# Patient Record
Sex: Female | Born: 1970
Health system: Southern US, Community
[De-identification: ages and names within clinical notes are randomized; demographics above are authoritative.]

## PROBLEM LIST (undated history)

## (undated) DIAGNOSIS — T7840XA Allergy, unspecified, initial encounter: Secondary | ICD-10-CM

## (undated) DIAGNOSIS — E119 Type 2 diabetes mellitus without complications: Secondary | ICD-10-CM

## (undated) DIAGNOSIS — C801 Malignant (primary) neoplasm, unspecified: Secondary | ICD-10-CM

## (undated) DIAGNOSIS — N809 Endometriosis, unspecified: Secondary | ICD-10-CM

## (undated) DIAGNOSIS — Z8669 Personal history of other diseases of the nervous system and sense organs: Secondary | ICD-10-CM

## (undated) DIAGNOSIS — F329 Major depressive disorder, single episode, unspecified: Secondary | ICD-10-CM

## (undated) DIAGNOSIS — K219 Gastro-esophageal reflux disease without esophagitis: Secondary | ICD-10-CM

## (undated) DIAGNOSIS — I10 Essential (primary) hypertension: Secondary | ICD-10-CM

## (undated) DIAGNOSIS — B019 Varicella without complication: Secondary | ICD-10-CM

## (undated) DIAGNOSIS — U071 COVID-19: Secondary | ICD-10-CM

## (undated) DIAGNOSIS — E785 Hyperlipidemia, unspecified: Secondary | ICD-10-CM

## (undated) DIAGNOSIS — F32A Depression, unspecified: Secondary | ICD-10-CM

## (undated) HISTORY — PX: BREAST SURGERY: SHX581

## (undated) HISTORY — DX: Hyperlipidemia, unspecified: E78.5

## (undated) HISTORY — DX: Personal history of other diseases of the nervous system and sense organs: Z86.69

## (undated) HISTORY — PX: OTHER SURGICAL HISTORY: SHX169

## (undated) HISTORY — DX: Allergy, unspecified, initial encounter: T78.40XA

## (undated) HISTORY — PX: CHOLECYSTECTOMY: SHX55

## (undated) HISTORY — PX: LEEP: SHX91

## (undated) HISTORY — PX: MOHS SURGERY: SUR867

## (undated) HISTORY — DX: Major depressive disorder, single episode, unspecified: F32.9

## (undated) HISTORY — DX: Endometriosis, unspecified: N80.9

## (undated) HISTORY — DX: COVID-19: U07.1

## (undated) HISTORY — DX: Varicella without complication: B01.9

## (undated) HISTORY — PX: ABDOMINAL HYSTERECTOMY: SHX81

## (undated) HISTORY — DX: Depression, unspecified: F32.A

## (undated) HISTORY — DX: Essential (primary) hypertension: I10

## (undated) HISTORY — DX: Malignant (primary) neoplasm, unspecified: C80.1

## (undated) HISTORY — DX: Gastro-esophageal reflux disease without esophagitis: K21.9

## (undated) HISTORY — DX: Type 2 diabetes mellitus without complications: E11.9

---

## 2005-04-14 ENCOUNTER — Emergency Department: Payer: Self-pay | Admitting: General Practice

## 2005-09-19 ENCOUNTER — Ambulatory Visit: Payer: Self-pay

## 2005-11-29 ENCOUNTER — Inpatient Hospital Stay: Payer: Self-pay

## 2006-11-12 ENCOUNTER — Ambulatory Visit: Payer: Self-pay | Admitting: Obstetrics & Gynecology

## 2006-12-03 ENCOUNTER — Ambulatory Visit: Payer: Self-pay | Admitting: Obstetrics & Gynecology

## 2006-12-31 ENCOUNTER — Ambulatory Visit: Payer: Self-pay | Admitting: Obstetrics & Gynecology

## 2006-12-31 ENCOUNTER — Observation Stay: Payer: Self-pay

## 2007-01-03 ENCOUNTER — Observation Stay: Payer: Self-pay

## 2007-01-24 ENCOUNTER — Other Ambulatory Visit: Payer: Self-pay

## 2007-01-24 ENCOUNTER — Inpatient Hospital Stay: Payer: Self-pay | Admitting: Unknown Physician Specialty

## 2007-01-28 ENCOUNTER — Ambulatory Visit: Payer: Self-pay | Admitting: Internal Medicine

## 2008-01-30 DIAGNOSIS — D239 Other benign neoplasm of skin, unspecified: Secondary | ICD-10-CM

## 2008-01-30 HISTORY — DX: Other benign neoplasm of skin, unspecified: D23.9

## 2008-12-16 ENCOUNTER — Ambulatory Visit: Payer: Self-pay | Admitting: Otolaryngology

## 2009-03-09 ENCOUNTER — Ambulatory Visit: Payer: Self-pay | Admitting: Unknown Physician Specialty

## 2009-09-28 ENCOUNTER — Ambulatory Visit: Payer: Self-pay | Admitting: Unknown Physician Specialty

## 2009-10-04 ENCOUNTER — Inpatient Hospital Stay: Payer: Self-pay | Admitting: Unknown Physician Specialty

## 2009-10-27 ENCOUNTER — Ambulatory Visit: Payer: Self-pay | Admitting: Unknown Physician Specialty

## 2010-03-13 ENCOUNTER — Ambulatory Visit: Payer: Self-pay | Admitting: Unknown Physician Specialty

## 2010-03-21 ENCOUNTER — Ambulatory Visit: Payer: Self-pay | Admitting: Unknown Physician Specialty

## 2010-10-22 ENCOUNTER — Inpatient Hospital Stay: Payer: Self-pay | Admitting: Internal Medicine

## 2011-03-27 ENCOUNTER — Ambulatory Visit: Payer: Self-pay | Admitting: Family Medicine

## 2011-05-10 IMAGING — CR DG CHEST 2V
1 series · 2 of 2 positions shown · non-contrast
Comparison: none

REASON FOR EXAM: chest pain
COMMENTS:

PROCEDURE:     DXR - DXR CHEST PA (OR AP) AND LATERAL  - October 22, 2010 [DATE]
RESULT:     The lungs are clear. The cardiac silhouette and visualized bony
skeleton are unremarkable.

[Series 1: view not recorded · 0.17mm/px · 2 of 2 slices shown]
[im 1/2]
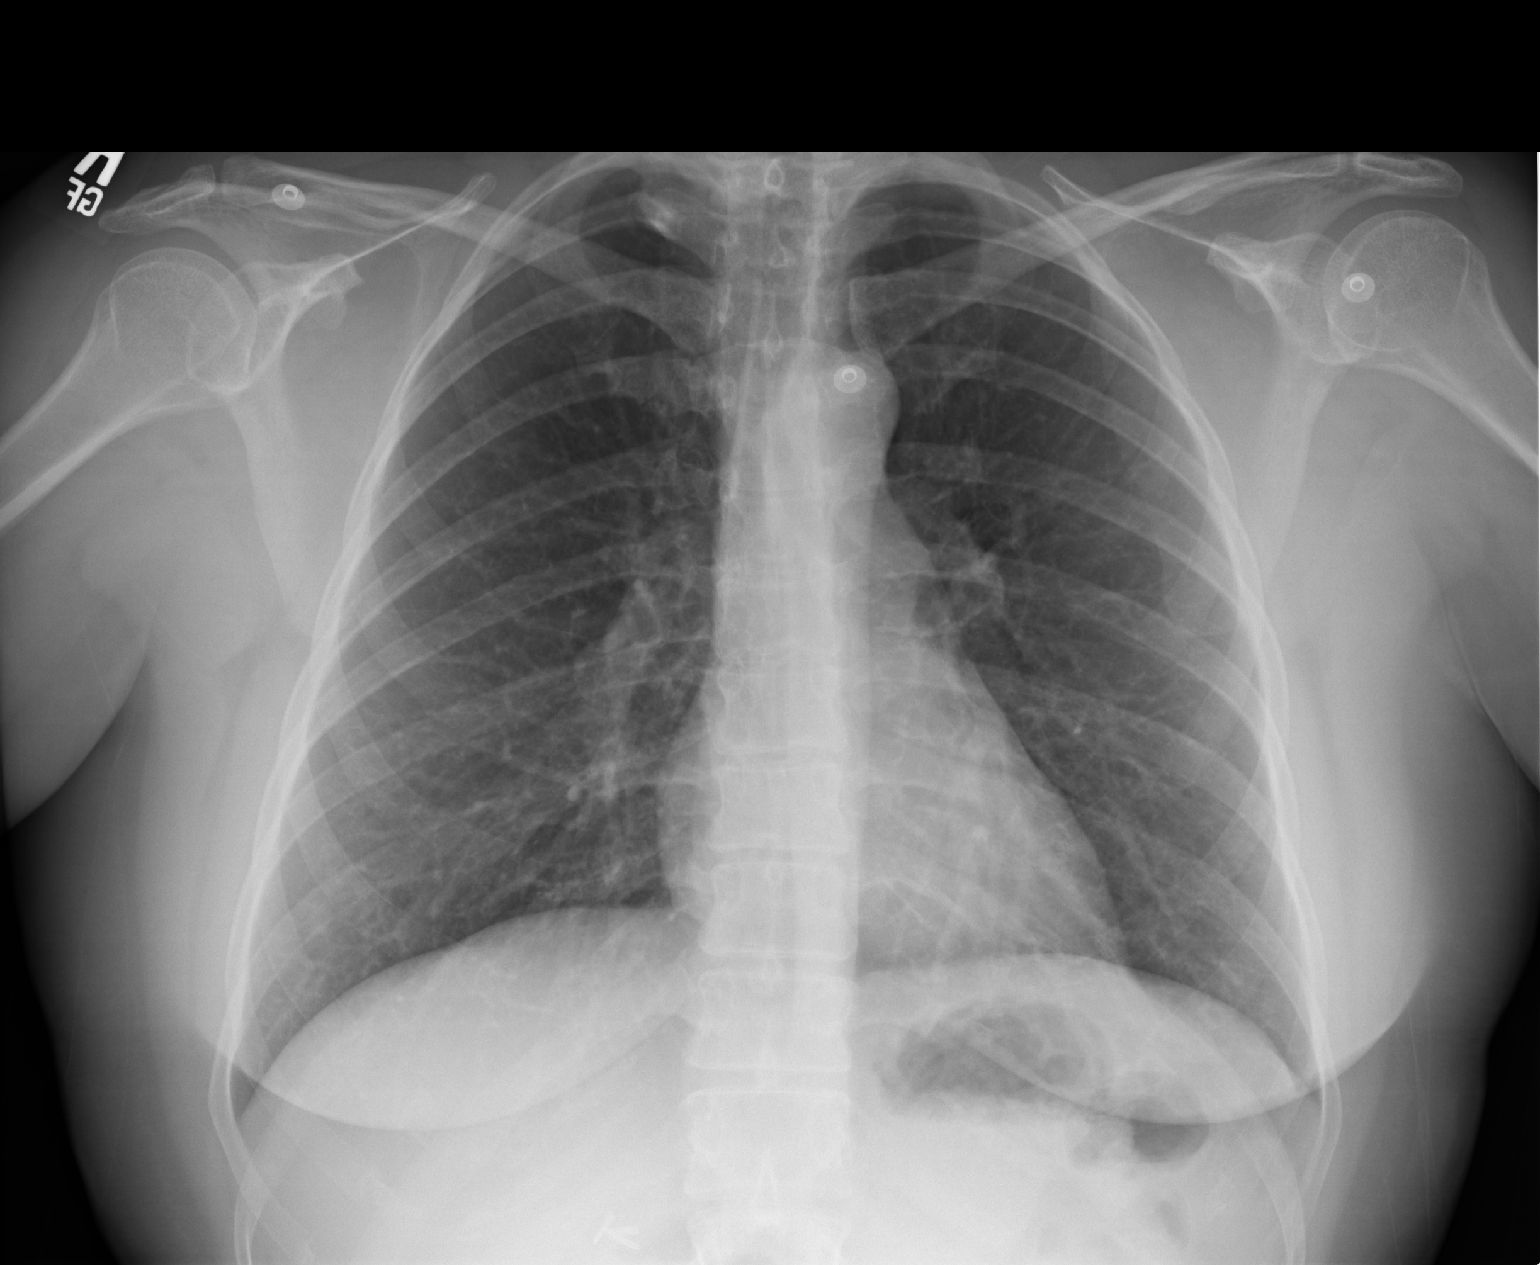
[im 2/2]
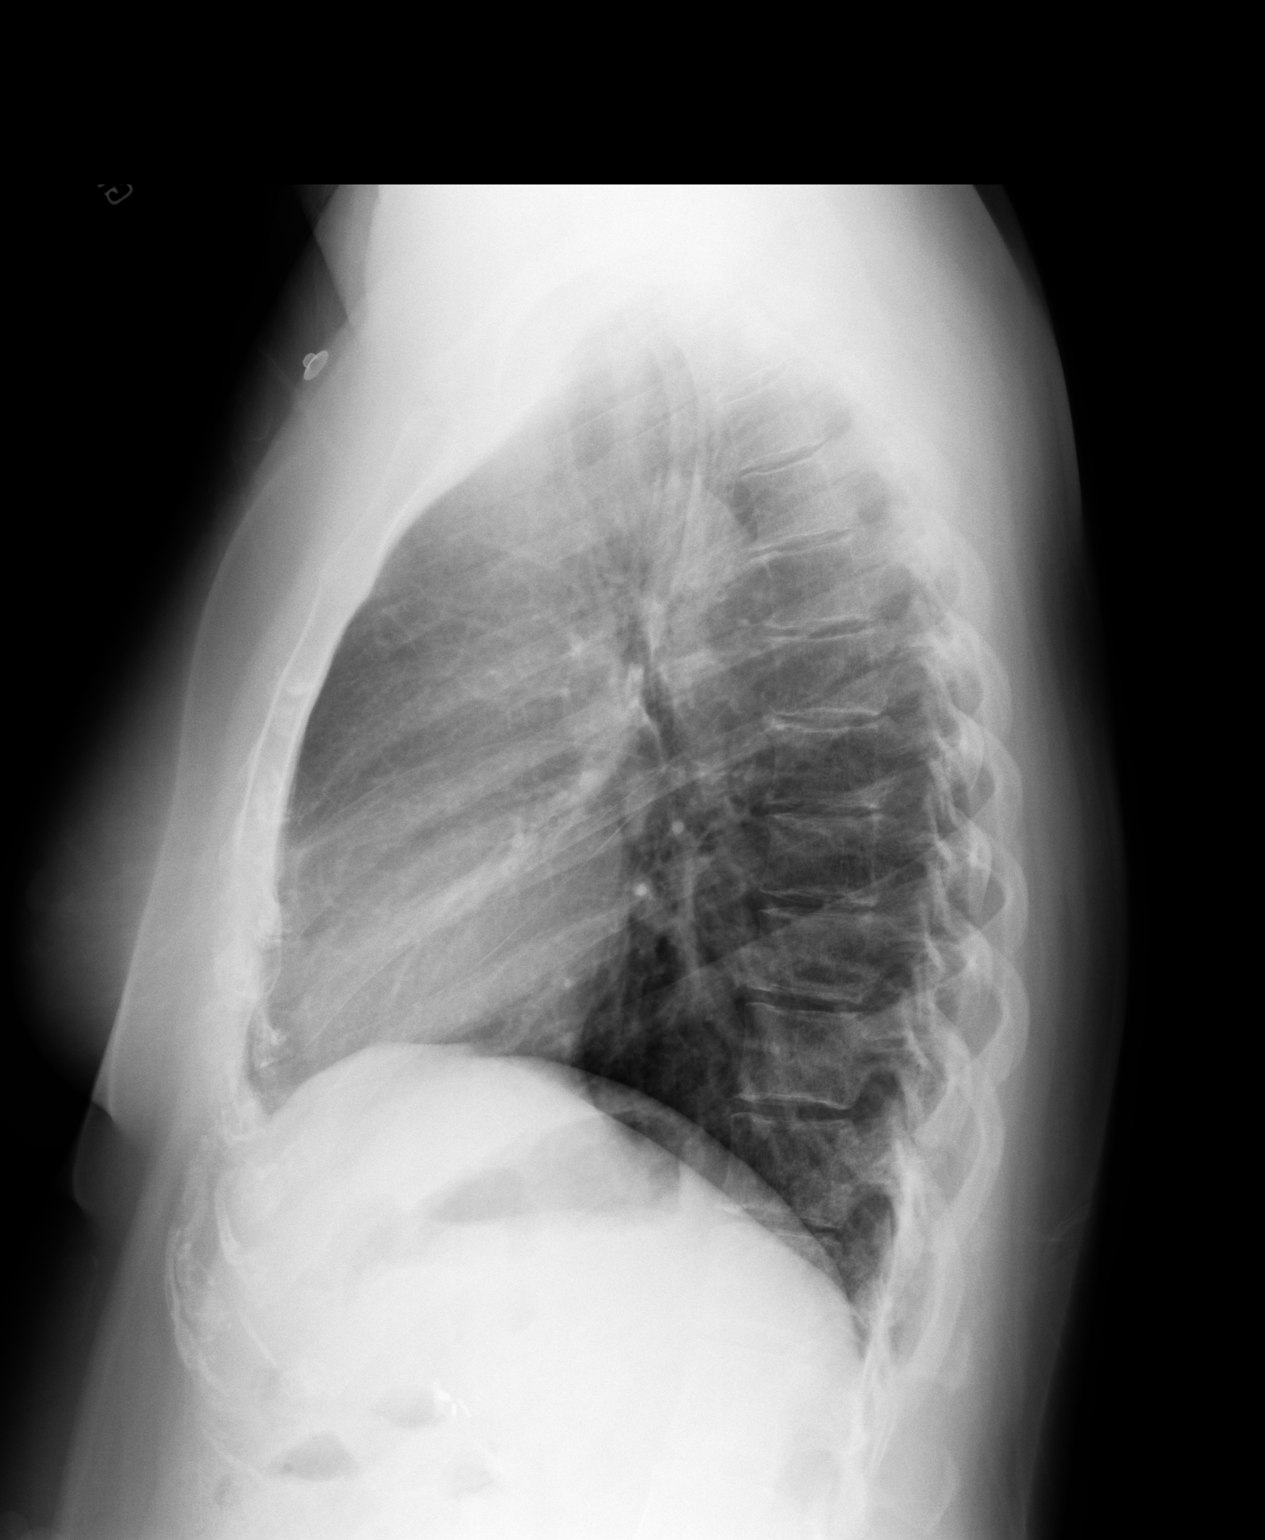

[2 of 2 positions shown; findings below may reference images not displayed]

IMPRESSION: 1. Chest radiograph without evidence of acute cardiopulmonary disease.

## 2012-04-10 ENCOUNTER — Ambulatory Visit: Payer: Self-pay | Admitting: Family Medicine

## 2012-06-12 ENCOUNTER — Ambulatory Visit: Payer: Self-pay | Admitting: Internal Medicine

## 2013-04-15 ENCOUNTER — Ambulatory Visit: Payer: Self-pay | Admitting: Family Medicine

## 2014-04-15 ENCOUNTER — Ambulatory Visit: Payer: Self-pay | Admitting: Family Medicine

## 2015-04-19 ENCOUNTER — Ambulatory Visit: Admit: 2015-04-19 | Disposition: A | Discharge status: Home / Self Care | Payer: Self-pay | Admitting: Family Medicine

## 2015-04-19 ENCOUNTER — Encounter: Payer: Self-pay | Admitting: Family Medicine

## 2015-05-09 LAB — HM PAP SMEAR: HM Pap smear: NEGATIVE

## 2017-05-02 DIAGNOSIS — Z1231 Encounter for screening mammogram for malignant neoplasm of breast: Secondary | ICD-10-CM | POA: Diagnosis not present

## 2017-05-29 DIAGNOSIS — I10 Essential (primary) hypertension: Secondary | ICD-10-CM | POA: Diagnosis not present

## 2017-05-29 DIAGNOSIS — K219 Gastro-esophageal reflux disease without esophagitis: Secondary | ICD-10-CM | POA: Diagnosis not present

## 2017-05-29 DIAGNOSIS — E1165 Type 2 diabetes mellitus with hyperglycemia: Secondary | ICD-10-CM | POA: Diagnosis not present

## 2017-05-29 DIAGNOSIS — E7800 Pure hypercholesterolemia, unspecified: Secondary | ICD-10-CM | POA: Diagnosis not present

## 2017-06-12 DIAGNOSIS — L700 Acne vulgaris: Secondary | ICD-10-CM | POA: Diagnosis not present

## 2017-06-12 DIAGNOSIS — L718 Other rosacea: Secondary | ICD-10-CM | POA: Diagnosis not present

## 2017-06-12 DIAGNOSIS — D485 Neoplasm of uncertain behavior of skin: Secondary | ICD-10-CM | POA: Diagnosis not present

## 2017-06-12 DIAGNOSIS — Z85828 Personal history of other malignant neoplasm of skin: Secondary | ICD-10-CM | POA: Diagnosis not present

## 2017-06-12 DIAGNOSIS — C44519 Basal cell carcinoma of skin of other part of trunk: Secondary | ICD-10-CM | POA: Diagnosis not present

## 2017-06-12 DIAGNOSIS — Z1283 Encounter for screening for malignant neoplasm of skin: Secondary | ICD-10-CM | POA: Diagnosis not present

## 2017-06-12 DIAGNOSIS — C4491 Basal cell carcinoma of skin, unspecified: Secondary | ICD-10-CM

## 2017-06-12 HISTORY — DX: Basal cell carcinoma of skin, unspecified: C44.91

## 2017-08-12 DIAGNOSIS — C44519 Basal cell carcinoma of skin of other part of trunk: Secondary | ICD-10-CM | POA: Diagnosis not present

## 2017-09-04 DIAGNOSIS — E1165 Type 2 diabetes mellitus with hyperglycemia: Secondary | ICD-10-CM | POA: Diagnosis not present

## 2017-09-04 DIAGNOSIS — E7800 Pure hypercholesterolemia, unspecified: Secondary | ICD-10-CM | POA: Diagnosis not present

## 2017-09-04 DIAGNOSIS — K219 Gastro-esophageal reflux disease without esophagitis: Secondary | ICD-10-CM | POA: Diagnosis not present

## 2017-09-04 DIAGNOSIS — I10 Essential (primary) hypertension: Secondary | ICD-10-CM | POA: Diagnosis not present

## 2017-12-28 DIAGNOSIS — S99921A Unspecified injury of right foot, initial encounter: Secondary | ICD-10-CM | POA: Diagnosis not present

## 2017-12-28 DIAGNOSIS — S99911A Unspecified injury of right ankle, initial encounter: Secondary | ICD-10-CM | POA: Diagnosis not present

## 2017-12-28 DIAGNOSIS — W108XXA Fall (on) (from) other stairs and steps, initial encounter: Secondary | ICD-10-CM | POA: Diagnosis not present

## 2018-01-30 DIAGNOSIS — E7800 Pure hypercholesterolemia, unspecified: Secondary | ICD-10-CM | POA: Diagnosis not present

## 2018-01-30 DIAGNOSIS — E119 Type 2 diabetes mellitus without complications: Secondary | ICD-10-CM | POA: Diagnosis not present

## 2018-01-30 DIAGNOSIS — I10 Essential (primary) hypertension: Secondary | ICD-10-CM | POA: Diagnosis not present

## 2018-01-30 DIAGNOSIS — K219 Gastro-esophageal reflux disease without esophagitis: Secondary | ICD-10-CM | POA: Diagnosis not present

## 2018-05-06 DIAGNOSIS — Z1231 Encounter for screening mammogram for malignant neoplasm of breast: Secondary | ICD-10-CM | POA: Diagnosis not present

## 2018-05-20 DIAGNOSIS — R928 Other abnormal and inconclusive findings on diagnostic imaging of breast: Secondary | ICD-10-CM | POA: Diagnosis not present

## 2018-06-02 DIAGNOSIS — R229 Localized swelling, mass and lump, unspecified: Secondary | ICD-10-CM | POA: Diagnosis not present

## 2018-06-02 DIAGNOSIS — D0511 Intraductal carcinoma in situ of right breast: Secondary | ICD-10-CM | POA: Diagnosis not present

## 2018-06-02 DIAGNOSIS — R920 Mammographic microcalcification found on diagnostic imaging of breast: Secondary | ICD-10-CM | POA: Diagnosis not present

## 2018-06-13 DIAGNOSIS — D0591 Unspecified type of carcinoma in situ of right breast: Secondary | ICD-10-CM | POA: Diagnosis not present

## 2018-06-13 DIAGNOSIS — Z803 Family history of malignant neoplasm of breast: Secondary | ICD-10-CM | POA: Diagnosis not present

## 2018-06-13 DIAGNOSIS — C50919 Malignant neoplasm of unspecified site of unspecified female breast: Secondary | ICD-10-CM | POA: Diagnosis not present

## 2018-06-25 DIAGNOSIS — Z853 Personal history of malignant neoplasm of breast: Secondary | ICD-10-CM | POA: Diagnosis not present

## 2018-07-16 DIAGNOSIS — Z87891 Personal history of nicotine dependence: Secondary | ICD-10-CM | POA: Diagnosis not present

## 2018-07-16 DIAGNOSIS — D0581 Other specified type of carcinoma in situ of right breast: Secondary | ICD-10-CM | POA: Diagnosis not present

## 2018-07-16 DIAGNOSIS — I10 Essential (primary) hypertension: Secondary | ICD-10-CM | POA: Diagnosis not present

## 2018-07-16 DIAGNOSIS — C50911 Malignant neoplasm of unspecified site of right female breast: Secondary | ICD-10-CM | POA: Diagnosis not present

## 2018-07-16 DIAGNOSIS — D0511 Intraductal carcinoma in situ of right breast: Secondary | ICD-10-CM | POA: Diagnosis not present

## 2018-07-16 DIAGNOSIS — E669 Obesity, unspecified: Secondary | ICD-10-CM | POA: Diagnosis not present

## 2018-07-16 DIAGNOSIS — D0591 Unspecified type of carcinoma in situ of right breast: Secondary | ICD-10-CM | POA: Diagnosis not present

## 2018-07-16 DIAGNOSIS — F419 Anxiety disorder, unspecified: Secondary | ICD-10-CM | POA: Diagnosis not present

## 2018-07-16 DIAGNOSIS — Z79899 Other long term (current) drug therapy: Secondary | ICD-10-CM | POA: Diagnosis not present

## 2018-07-16 DIAGNOSIS — N6012 Diffuse cystic mastopathy of left breast: Secondary | ICD-10-CM | POA: Diagnosis not present

## 2018-07-16 DIAGNOSIS — Z6835 Body mass index (BMI) 35.0-35.9, adult: Secondary | ICD-10-CM | POA: Diagnosis not present

## 2018-07-16 DIAGNOSIS — K219 Gastro-esophageal reflux disease without esophagitis: Secondary | ICD-10-CM | POA: Diagnosis not present

## 2018-07-16 DIAGNOSIS — D242 Benign neoplasm of left breast: Secondary | ICD-10-CM | POA: Diagnosis not present

## 2018-07-16 DIAGNOSIS — Z853 Personal history of malignant neoplasm of breast: Secondary | ICD-10-CM | POA: Diagnosis not present

## 2018-08-04 DIAGNOSIS — Z9882 Breast implant status: Secondary | ICD-10-CM | POA: Diagnosis not present

## 2018-08-04 DIAGNOSIS — D0591 Unspecified type of carcinoma in situ of right breast: Secondary | ICD-10-CM | POA: Diagnosis not present

## 2018-08-04 DIAGNOSIS — Z09 Encounter for follow-up examination after completed treatment for conditions other than malignant neoplasm: Secondary | ICD-10-CM | POA: Diagnosis not present

## 2018-08-04 DIAGNOSIS — I10 Essential (primary) hypertension: Secondary | ICD-10-CM | POA: Diagnosis not present

## 2018-08-04 DIAGNOSIS — Z86000 Personal history of in-situ neoplasm of breast: Secondary | ICD-10-CM | POA: Diagnosis not present

## 2018-08-04 DIAGNOSIS — Z87891 Personal history of nicotine dependence: Secondary | ICD-10-CM | POA: Diagnosis not present

## 2018-08-04 DIAGNOSIS — Z9013 Acquired absence of bilateral breasts and nipples: Secondary | ICD-10-CM | POA: Diagnosis not present

## 2018-08-11 ENCOUNTER — Encounter: Payer: Self-pay | Admitting: Certified Nurse Midwife

## 2018-08-11 DIAGNOSIS — D0511 Intraductal carcinoma in situ of right breast: Secondary | ICD-10-CM | POA: Insufficient documentation

## 2018-08-11 HISTORY — DX: Intraductal carcinoma in situ of right breast: D05.11

## 2018-09-12 DIAGNOSIS — E7800 Pure hypercholesterolemia, unspecified: Secondary | ICD-10-CM | POA: Diagnosis not present

## 2018-09-12 DIAGNOSIS — I10 Essential (primary) hypertension: Secondary | ICD-10-CM | POA: Diagnosis not present

## 2018-09-12 DIAGNOSIS — K219 Gastro-esophageal reflux disease without esophagitis: Secondary | ICD-10-CM | POA: Diagnosis not present

## 2018-09-12 DIAGNOSIS — E1165 Type 2 diabetes mellitus with hyperglycemia: Secondary | ICD-10-CM | POA: Diagnosis not present

## 2018-10-01 DIAGNOSIS — Z853 Personal history of malignant neoplasm of breast: Secondary | ICD-10-CM | POA: Diagnosis not present

## 2018-10-01 DIAGNOSIS — E669 Obesity, unspecified: Secondary | ICD-10-CM | POA: Diagnosis not present

## 2018-10-01 DIAGNOSIS — Z87891 Personal history of nicotine dependence: Secondary | ICD-10-CM | POA: Diagnosis not present

## 2018-10-01 DIAGNOSIS — K219 Gastro-esophageal reflux disease without esophagitis: Secondary | ICD-10-CM | POA: Diagnosis not present

## 2018-10-01 DIAGNOSIS — I10 Essential (primary) hypertension: Secondary | ICD-10-CM | POA: Diagnosis not present

## 2018-10-01 DIAGNOSIS — Z6834 Body mass index (BMI) 34.0-34.9, adult: Secondary | ICD-10-CM | POA: Diagnosis not present

## 2018-10-01 DIAGNOSIS — E119 Type 2 diabetes mellitus without complications: Secondary | ICD-10-CM | POA: Diagnosis not present

## 2018-10-01 DIAGNOSIS — Z421 Encounter for breast reconstruction following mastectomy: Secondary | ICD-10-CM | POA: Diagnosis not present

## 2018-10-09 DIAGNOSIS — C50911 Malignant neoplasm of unspecified site of right female breast: Secondary | ICD-10-CM | POA: Diagnosis not present

## 2018-10-28 DIAGNOSIS — C50911 Malignant neoplasm of unspecified site of right female breast: Secondary | ICD-10-CM | POA: Diagnosis not present

## 2018-11-14 DIAGNOSIS — Z853 Personal history of malignant neoplasm of breast: Secondary | ICD-10-CM | POA: Diagnosis not present

## 2018-11-14 DIAGNOSIS — Z833 Family history of diabetes mellitus: Secondary | ICD-10-CM | POA: Diagnosis not present

## 2018-11-14 DIAGNOSIS — N611 Abscess of the breast and nipple: Secondary | ICD-10-CM | POA: Diagnosis not present

## 2018-11-14 DIAGNOSIS — I10 Essential (primary) hypertension: Secondary | ICD-10-CM | POA: Diagnosis not present

## 2018-11-14 DIAGNOSIS — Z421 Encounter for breast reconstruction following mastectomy: Secondary | ICD-10-CM | POA: Diagnosis not present

## 2018-11-14 DIAGNOSIS — Z79899 Other long term (current) drug therapy: Secondary | ICD-10-CM | POA: Diagnosis not present

## 2018-11-14 DIAGNOSIS — L905 Scar conditions and fibrosis of skin: Secondary | ICD-10-CM | POA: Diagnosis not present

## 2018-11-14 DIAGNOSIS — Z808 Family history of malignant neoplasm of other organs or systems: Secondary | ICD-10-CM | POA: Diagnosis not present

## 2018-11-14 DIAGNOSIS — Z803 Family history of malignant neoplasm of breast: Secondary | ICD-10-CM | POA: Diagnosis not present

## 2018-11-14 DIAGNOSIS — Z9013 Acquired absence of bilateral breasts and nipples: Secondary | ICD-10-CM | POA: Diagnosis not present

## 2018-12-02 ENCOUNTER — Encounter: Payer: Self-pay | Admitting: Internal Medicine

## 2018-12-10 DIAGNOSIS — C50911 Malignant neoplasm of unspecified site of right female breast: Secondary | ICD-10-CM | POA: Diagnosis not present

## 2018-12-10 DIAGNOSIS — Z9012 Acquired absence of left breast and nipple: Secondary | ICD-10-CM | POA: Diagnosis not present

## 2018-12-15 DIAGNOSIS — I10 Essential (primary) hypertension: Secondary | ICD-10-CM | POA: Diagnosis not present

## 2018-12-15 DIAGNOSIS — E1165 Type 2 diabetes mellitus with hyperglycemia: Secondary | ICD-10-CM | POA: Diagnosis not present

## 2018-12-15 DIAGNOSIS — E7800 Pure hypercholesterolemia, unspecified: Secondary | ICD-10-CM | POA: Diagnosis not present

## 2018-12-15 DIAGNOSIS — K219 Gastro-esophageal reflux disease without esophagitis: Secondary | ICD-10-CM | POA: Diagnosis not present

## 2019-01-06 DIAGNOSIS — L578 Other skin changes due to chronic exposure to nonionizing radiation: Secondary | ICD-10-CM | POA: Diagnosis not present

## 2019-01-06 DIAGNOSIS — L570 Actinic keratosis: Secondary | ICD-10-CM | POA: Diagnosis not present

## 2019-01-06 DIAGNOSIS — D485 Neoplasm of uncertain behavior of skin: Secondary | ICD-10-CM | POA: Diagnosis not present

## 2019-01-06 DIAGNOSIS — Z1283 Encounter for screening for malignant neoplasm of skin: Secondary | ICD-10-CM | POA: Diagnosis not present

## 2019-01-12 DIAGNOSIS — L03211 Cellulitis of face: Secondary | ICD-10-CM | POA: Diagnosis not present

## 2019-01-12 DIAGNOSIS — B0052 Herpesviral keratitis: Secondary | ICD-10-CM | POA: Diagnosis not present

## 2019-01-12 DIAGNOSIS — B0089 Other herpesviral infection: Secondary | ICD-10-CM | POA: Diagnosis not present

## 2019-01-19 DIAGNOSIS — B0052 Herpesviral keratitis: Secondary | ICD-10-CM | POA: Diagnosis not present

## 2019-02-12 ENCOUNTER — Other Ambulatory Visit: Payer: Self-pay

## 2019-02-12 ENCOUNTER — Ambulatory Visit: Payer: BLUE CROSS/BLUE SHIELD | Admitting: Internal Medicine

## 2019-02-12 VITALS — BP 126/84 | HR 80 | Temp 98.2°F | Ht 62.0 in | Wt 185.8 lb

## 2019-02-12 DIAGNOSIS — Z853 Personal history of malignant neoplasm of breast: Secondary | ICD-10-CM | POA: Diagnosis not present

## 2019-02-12 DIAGNOSIS — I10 Essential (primary) hypertension: Secondary | ICD-10-CM

## 2019-02-12 DIAGNOSIS — E785 Hyperlipidemia, unspecified: Secondary | ICD-10-CM

## 2019-02-12 DIAGNOSIS — E559 Vitamin D deficiency, unspecified: Secondary | ICD-10-CM

## 2019-02-12 DIAGNOSIS — F32A Depression, unspecified: Secondary | ICD-10-CM

## 2019-02-12 DIAGNOSIS — E119 Type 2 diabetes mellitus without complications: Secondary | ICD-10-CM | POA: Diagnosis not present

## 2019-02-12 DIAGNOSIS — F329 Major depressive disorder, single episode, unspecified: Secondary | ICD-10-CM

## 2019-02-12 NOTE — Progress Notes (Signed)
Pre visit review using our clinic review tool, if applicable. No additional management support is needed unless otherwise documented below in the visit note. 

## 2019-02-12 NOTE — Patient Instructions (Signed)
Please get me a copy of your labs from 11/2018   Diabetes Mellitus and Nutrition, Adult When you have diabetes (diabetes mellitus), it is very important to have healthy eating habits because your blood sugar (glucose) levels are greatly affected by what you eat and drink. Eating healthy foods in the appropriate amounts, at about the same times every day, can help you:  Control your blood glucose.  Lower your risk of heart disease.  Improve your blood pressure.  Reach or maintain a healthy weight. Every person with diabetes is different, and each person has different needs for a meal plan. Your health care provider may recommend that you work with a diet and nutrition specialist (dietitian) to make a meal plan that is best for you. Your meal plan may vary depending on factors such as:  The calories you need.  The medicines you take.  Your weight.  Your blood glucose, blood pressure, and cholesterol levels.  Your activity level.  Other health conditions you have, such as heart or kidney disease. How do carbohydrates affect me? Carbohydrates, also called carbs, affect your blood glucose level more than any other type of food. Eating carbs naturally raises the amount of glucose in your blood. Carb counting is a method for keeping track of how many carbs you eat. Counting carbs is important to keep your blood glucose at a healthy level, especially if you use insulin or take certain oral diabetes medicines. It is important to know how many carbs you can safely have in each meal. This is different for every person. Your dietitian can help you calculate how many carbs you should have at each meal and for each snack. Foods that contain carbs include:  Bread, cereal, rice, pasta, and crackers.  Potatoes and corn.  Peas, beans, and lentils.  Milk and yogurt.  Fruit and juice.  Desserts, such as cakes, cookies, ice cream, and candy. How does alcohol affect me? Alcohol can cause a sudden  decrease in blood glucose (hypoglycemia), especially if you use insulin or take certain oral diabetes medicines. Hypoglycemia can be a life-threatening condition. Symptoms of hypoglycemia (sleepiness, dizziness, and confusion) are similar to symptoms of having too much alcohol. If your health care provider says that alcohol is safe for you, follow these guidelines:  Limit alcohol intake to no more than 1 drink per day for nonpregnant women and 2 drinks per day for men. One drink equals 12 oz of beer, 5 oz of wine, or 1 oz of hard liquor.  Do not drink on an empty stomach.  Keep yourself hydrated with water, diet soda, or unsweetened iced tea.  Keep in mind that regular soda, juice, and other mixers may contain a lot of sugar and must be counted as carbs. What are tips for following this plan?  Reading food labels  Start by checking the serving size on the "Nutrition Facts" label of packaged foods and drinks. The amount of calories, carbs, fats, and other nutrients listed on the label is based on one serving of the item. Many items contain more than one serving per package.  Check the total grams (g) of carbs in one serving. You can calculate the number of servings of carbs in one serving by dividing the total carbs by 15. For example, if a food has 30 g of total carbs, it would be equal to 2 servings of carbs.  Check the number of grams (g) of saturated and trans fats in one serving. Choose foods that have  low or no amount of these fats.  Check the number of milligrams (mg) of salt (sodium) in one serving. Most people should limit total sodium intake to less than 2,300 mg per day.  Always check the nutrition information of foods labeled as "low-fat" or "nonfat". These foods may be higher in added sugar or refined carbs and should be avoided.  Talk to your dietitian to identify your daily goals for nutrients listed on the label. Shopping  Avoid buying canned, premade, or processed foods.  These foods tend to be high in fat, sodium, and added sugar.  Shop around the outside edge of the grocery store. This includes fresh fruits and vegetables, bulk grains, fresh meats, and fresh dairy. Cooking  Use low-heat cooking methods, such as baking, instead of high-heat cooking methods like deep frying.  Cook using healthy oils, such as olive, canola, or sunflower oil.  Avoid cooking with butter, cream, or high-fat meats. Meal planning  Eat meals and snacks regularly, preferably at the same times every day. Avoid going long periods of time without eating.  Eat foods high in fiber, such as fresh fruits, vegetables, beans, and whole grains. Talk to your dietitian about how many servings of carbs you can eat at each meal.  Eat 4-6 ounces (oz) of lean protein each day, such as lean meat, chicken, fish, eggs, or tofu. One oz of lean protein is equal to: ? 1 oz of meat, chicken, or fish. ? 1 egg. ?  cup of tofu.  Eat some foods each day that contain healthy fats, such as avocado, nuts, seeds, and fish. Lifestyle  Check your blood glucose regularly.  Exercise regularly as told by your health care provider. This may include: ? 150 minutes of moderate-intensity or vigorous-intensity exercise each week. This could be brisk walking, biking, or water aerobics. ? Stretching and doing strength exercises, such as yoga or weightlifting, at least 2 times a week.  Take medicines as told by your health care provider.  Do not use any products that contain nicotine or tobacco, such as cigarettes and e-cigarettes. If you need help quitting, ask your health care provider.  Work with a Social worker or diabetes educator to identify strategies to manage stress and any emotional and social challenges. Questions to ask a health care provider  Do I need to meet with a diabetes educator?  Do I need to meet with a dietitian?  What number can I call if I have questions?  When are the best times to check  my blood glucose? Where to find more information:  American Diabetes Association: diabetes.org  Academy of Nutrition and Dietetics: www.eatright.[employer] of Diabetes and Digestive and Kidney Diseases (NIH): [employer] Summary  A healthy meal plan will help you control your blood glucose and maintain a healthy lifestyle.  Working with a diet and nutrition specialist (dietitian) can help you make a meal plan that is best for you.  Keep in mind that carbohydrates (carbs) and alcohol have immediate effects on your blood glucose levels. It is important to count carbs and to use alcohol carefully. This information is not intended to replace advice given to you by your health care provider. Make sure you discuss any questions you have with your health care provider. Document Released: 09/13/2005 Document Revised: 07/17/2017 Document Reviewed: 01/21/2017 Elsevier Interactive Patient Education  2019 [employer].

## 2019-02-13 ENCOUNTER — Encounter: Payer: Self-pay | Admitting: Internal Medicine

## 2019-02-13 DIAGNOSIS — I10 Essential (primary) hypertension: Secondary | ICD-10-CM | POA: Insufficient documentation

## 2019-02-13 DIAGNOSIS — E559 Vitamin D deficiency, unspecified: Secondary | ICD-10-CM | POA: Insufficient documentation

## 2019-02-13 DIAGNOSIS — F329 Major depressive disorder, single episode, unspecified: Secondary | ICD-10-CM | POA: Insufficient documentation

## 2019-02-13 DIAGNOSIS — E119 Type 2 diabetes mellitus without complications: Secondary | ICD-10-CM | POA: Insufficient documentation

## 2019-02-13 DIAGNOSIS — E785 Hyperlipidemia, unspecified: Secondary | ICD-10-CM | POA: Insufficient documentation

## 2019-02-13 DIAGNOSIS — Z853 Personal history of malignant neoplasm of breast: Secondary | ICD-10-CM | POA: Insufficient documentation

## 2019-02-13 DIAGNOSIS — F32A Depression, unspecified: Secondary | ICD-10-CM | POA: Insufficient documentation

## 2019-02-13 NOTE — Progress Notes (Addendum)
Chief Complaint  Patient presents with  . Establish Care   New patient  1. DM 2 last A1C 7.5 per pt can see 7.7 /06/2018 on metformin 500 mg bid 2. HTN/HLD on lis 10 mg qd and crestor 5 mg qhs controlled  3. H/o breast cancer DCIS s/p mastectomy f/u with Dr. Harl Bowie in [place]  -no chemo or radiation done in the past  4. H/o depression on celexa 40 mg qd    Review of Systems  Constitutional: Negative for weight loss.  HENT: Negative for hearing loss.   Eyes: Negative for blurred vision.  Respiratory: Negative for shortness of breath.   Cardiovascular: Negative for chest pain.  Gastrointestinal: Negative for abdominal pain.  Musculoskeletal: Negative for joint pain.  Skin: Negative for rash.  Neurological: Negative for headaches.  Psychiatric/Behavioral: Negative for depression. The patient is not nervous/anxious.    History reviewed. No pertinent past medical history. Past Surgical History:  Procedure Laterality Date  . Bilateral nipple sparing mastectomies     Dr Charlane Ferretti   Family History  Problem Relation Age of Onset  . Asthma Mother   . COPD Mother   . Depression Mother   . Diabetes Mother   . Early death Father   . Hypertension Sister   . Alcohol abuse Maternal Grandmother   . Cancer Maternal Grandmother        breast  . Alcohol abuse Maternal Grandfather   . Cancer Maternal Grandfather        lung  . Cancer Paternal Grandmother        brain   . Heart disease Paternal Grandfather   . COPD Sister   . Depression Sister    Social History   Socioeconomic History  . Marital status: Married    Spouse name: Not on file  . Number of children: Not on file  . Years of education: Not on file  . Highest education level: Not on file  Occupational History  . Not on file  Social Needs  . Financial resource strain: Not on file  . Food insecurity:    Worry: Not on file    Inability: Not on file  . Transportation needs:    Medical: Not on file    Non-medical: Not  on file  Tobacco Use  . Smoking status: Former Research scientist (life sciences)  . Smokeless tobacco: Never Used  . Tobacco comment: former x 7 years max 1 ppd   Substance and Sexual Activity  . Alcohol use: Yes  . Drug use: Not Currently  . Sexual activity: Yes    Comment: men  Lifestyle  . Physical activity:    Days per week: Not on file    Minutes per session: Not on file  . Stress: Not on file  Relationships  . Social connections:    Talks on phone: Not on file    Gets together: Not on file    Attends religious service: Not on file    Active member of club or organization: Not on file    Attends meetings of clubs or organizations: Not on file    Relationship status: Not on file  . Intimate partner violence:    Fear of current or ex partner: Not on file    Emotionally abused: Not on file    Physically abused: Not on file    Forced sexual activity: Not on file  Other Topics Concern  . Not on file  Social History Narrative   Married    Some  college    Customer service rep    Owns guns, wears seat belt, safe in relationship    Current Meds  Medication Sig  . citalopram (CELEXA) 40 MG tablet Take 40 mg by mouth daily.   Marland Kitchen lisinopril (PRINIVIL,ZESTRIL) 10 MG tablet Take 10 mg by mouth daily.   . metFORMIN (GLUCOPHAGE) 500 MG tablet 500 mg 2 (two) times daily with a meal.   . rosuvastatin (CRESTOR) 5 MG tablet Take 5 mg by mouth at bedtime.   . valACYclovir (VALTREX) 1000 MG tablet Take 1,000 mg by mouth as needed.   . Vitamin D, Ergocalciferol, (DRISDOL) 1.25 MG (50000 UT) CAPS capsule Take 50,000 Units by mouth every 7 (seven) days.    No Known Allergies No results found for this or any previous visit (from the past 2160 hour(s)). Objective  Body mass index is 33.98 kg/m. Wt Readings from Last 3 Encounters:  02/12/19 185 lb 12.8 oz (84.3 kg)   Temp Readings from Last 3 Encounters:  02/12/19 98.2 F (36.8 C)   BP Readings from Last 3 Encounters:  02/12/19 126/84   Pulse Readings from  Last 3 Encounters:  02/12/19 80    Physical Exam Vitals signs and nursing note reviewed.  Constitutional:      Appearance: Normal appearance. She is well-developed and well-groomed.  HENT:     Head: Normocephalic and atraumatic.     Nose: Nose normal.     Mouth/Throat:     Mouth: Mucous membranes are moist.     Pharynx: Oropharynx is clear.  Eyes:     Conjunctiva/sclera: Conjunctivae normal.     Pupils: Pupils are equal, round, and reactive to light.  Cardiovascular:     Rate and Rhythm: Normal rate and regular rhythm.     Heart sounds: Normal heart sounds.  Pulmonary:     Effort: Pulmonary effort is normal.     Breath sounds: Normal breath sounds.  Skin:    General: Skin is warm and dry.  Neurological:     General: No focal deficit present.     Mental Status: She is alert and oriented to person, place, and time. Mental status is at baseline.     Gait: Gait normal.  Psychiatric:        Attention and Perception: Attention and perception normal.        Mood and Affect: Mood and affect normal.        Speech: Speech normal.        Behavior: Behavior normal. Behavior is cooperative.        Thought Content: Thought content normal.        Cognition and Memory: Cognition and memory normal.        Judgment: Judgment normal.     Assessment   1. DM 2 last A1C 7.5 per pt can see 7/7 /06/2018  2. HTN/HLD  3. H/o breast cancer DCIS s/p mastectomy f/u with Dr. Harl Bowie in [place]  -no chemo or radiation done  4. H/o depression  5. HM Plan  1. Cont meds metformin 500 mg bid  Foot exam in future  Pt will try to get labs from her job had 11/2018   Eye exam [facility] 10/2019  2. Cont meds lis 10 mg qd, crestor 5 mg qhs  3. F/u Dr. Harl Bowie in [place] appt 06/2019  4. Cont meds celexa 40 mg qd  5.  Flu shot 09/2018 per pt  Tdap in 2016 per pt  Consider mmr  and hep B in the future   S/p mastectomy  Pap q5 years due at f/u last had [facility] get records do q5 years h/o abnormal  pap h/o LEEP 1996  -get last pap records  Colonoscopy age 89 y.o  Derm Dr. Phillip Heal saw 12/2018 due to see again 06/2019 had bx left lower leg    Dr. Dione Housekeeper eye doctor saw 10/2019  Dr. Rodman Key dentist  Labs [facility] date 12/02/18 but initial eval of pt was 02/12/2019 CBC normal, CMETnormal, TC 229, TGs 266, HDL 40, LDL 136  Urine normal  A1C 7.1 Vitamin D low 15.7   -will re order labs for 03/04/2019 [facility]   Labs had 05/08/2019 [facility] Wbc 4.8 h/h 12.7/35.4 plts 275  Glucose 131 elevated Cr 1.05 GFR 64-74  A1C 7.2  Na 141  K 4.1 Cl 103  Calcium 9.3  Total protein 9.3  Albumin 4.4  Total bilirubin 0.4  AST 28  ALT 32   TC 122 TG 98 HDL 41, LDL 61  Mumps low on MMR rec MMR  Urine protein normal  TSH 1.910, Ft4 1.24  Hep B 82.7  Vitamin D 82.9    Provider: Dr. Olivia Mackie Mc[facility]

## 2019-02-15 ENCOUNTER — Telehealth: Payer: Self-pay | Admitting: Internal Medicine

## 2019-02-15 NOTE — Telephone Encounter (Signed)
Entered in error  TMS 

## 2019-02-16 NOTE — Progress Notes (Signed)
No current immunization within date for patient.

## 2019-02-16 NOTE — Progress Notes (Signed)
No immunizations in [facility]   [place]

## 2019-02-17 DIAGNOSIS — M9901 Segmental and somatic dysfunction of cervical region: Secondary | ICD-10-CM | POA: Diagnosis not present

## 2019-02-17 DIAGNOSIS — M531 Cervicobrachial syndrome: Secondary | ICD-10-CM | POA: Diagnosis not present

## 2019-02-17 DIAGNOSIS — M436 Torticollis: Secondary | ICD-10-CM | POA: Diagnosis not present

## 2019-02-17 DIAGNOSIS — M9902 Segmental and somatic dysfunction of thoracic region: Secondary | ICD-10-CM | POA: Diagnosis not present

## 2019-02-24 ENCOUNTER — Encounter: Payer: Self-pay | Admitting: Internal Medicine

## 2019-04-08 ENCOUNTER — Telehealth: Payer: Self-pay | Admitting: Internal Medicine

## 2019-04-08 NOTE — Telephone Encounter (Addendum)
Relation to pt: self  Call back number: [phone] Pharmacy: [place], [place] [facility] [address]  [place] [address]  Phone: [phone] Fax: [phone]  Not a 24 hour pharmacy; exact hours not known     Reason for call:  Patient returned call and stated she has a fever blister which appeared yesterday.

## 2019-04-08 NOTE — Telephone Encounter (Signed)
Left message for patient to return call back. [facility] may give information.  

## 2019-04-08 NOTE — Telephone Encounter (Signed)
Why does she need Valtrex refilled?   [facility]

## 2019-04-09 ENCOUNTER — Other Ambulatory Visit: Payer: Self-pay | Admitting: Internal Medicine

## 2019-04-09 DIAGNOSIS — B009 Herpesviral infection, unspecified: Secondary | ICD-10-CM

## 2019-04-09 MED ORDER — VALACYCLOVIR HCL 1 G PO TABS: 1000.0000 mg | ORAL_TABLET | Freq: Two times a day (BID) | ORAL | 1 refills | Status: DC

## 2019-05-12 ENCOUNTER — Telehealth: Payer: Self-pay | Admitting: Internal Medicine

## 2019-05-12 NOTE — Telephone Encounter (Signed)
Labs had 05/08/2019 [facility] Blood counts normal  A1c 7.2 goal < 7.0 rec healthy diet and exercise increase water intake   Liver #s normal kidney #s  Slightly elevated creatinine rec increase water intake avoid NSAIDS   Cholesterol ok #s great  TC 122 TG 98 HDL 41, LDL 61  Mumps low on MMR rec MMR vaccine at pharmacy in future   Urine normal Thyroid labs normal   Hep B 82.7 -protected  Vitamin D 82.9 normal   TMS

## 2019-05-13 ENCOUNTER — Encounter: Payer: Self-pay | Admitting: Internal Medicine

## 2019-05-13 ENCOUNTER — Other Ambulatory Visit: Payer: Self-pay

## 2019-05-13 ENCOUNTER — Ambulatory Visit (INDEPENDENT_AMBULATORY_CARE_PROVIDER_SITE_OTHER): Payer: BLUE CROSS/BLUE SHIELD | Admitting: Internal Medicine

## 2019-05-13 DIAGNOSIS — E785 Hyperlipidemia, unspecified: Secondary | ICD-10-CM | POA: Diagnosis not present

## 2019-05-13 DIAGNOSIS — Z853 Personal history of malignant neoplasm of breast: Secondary | ICD-10-CM

## 2019-05-13 DIAGNOSIS — E559 Vitamin D deficiency, unspecified: Secondary | ICD-10-CM

## 2019-05-13 DIAGNOSIS — F32A Depression, unspecified: Secondary | ICD-10-CM

## 2019-05-13 DIAGNOSIS — I10 Essential (primary) hypertension: Secondary | ICD-10-CM | POA: Diagnosis not present

## 2019-05-13 DIAGNOSIS — F329 Major depressive disorder, single episode, unspecified: Secondary | ICD-10-CM

## 2019-05-13 DIAGNOSIS — D0511 Intraductal carcinoma in situ of right breast: Secondary | ICD-10-CM

## 2019-05-13 DIAGNOSIS — E119 Type 2 diabetes mellitus without complications: Secondary | ICD-10-CM | POA: Diagnosis not present

## 2019-05-13 MED ORDER — LISINOPRIL 10 MG PO TABS: 10.0000 mg | ORAL_TABLET | Freq: Every day | ORAL | 3 refills | Status: DC

## 2019-05-13 MED ORDER — ROSUVASTATIN CALCIUM 5 MG PO TABS: 5.0000 mg | ORAL_TABLET | Freq: Every day | ORAL | 3 refills | Status: DC

## 2019-05-13 MED ORDER — METFORMIN HCL 500 MG PO TABS: 500.0000 mg | ORAL_TABLET | Freq: Two times a day (BID) | ORAL | 3 refills | Status: DC

## 2019-05-13 MED ORDER — CITALOPRAM HYDROBROMIDE 40 MG PO TABS: 40.0000 mg | ORAL_TABLET | Freq: Every day | ORAL | 3 refills | Status: DC

## 2019-05-13 NOTE — Telephone Encounter (Signed)
Patient was informed of results.  Patient understood and no questions, comments, or concerns at this time.  

## 2019-05-13 NOTE — Progress Notes (Signed)
Virtual Visit via Video Note  I connected with Sandra Griffin   on 05/13/19 at  3:38 PM EDT by a video enabled telemedicine application and verified that I am speaking with the correct person using two identifiers.  Location patient: home Location provider:work  Persons participating in the virtual visit: patient, provider, pts kids   I discussed the limitations of evaluation and management by telemedicine and the availability of in person appointments. The patient expressed understanding and agreed to proceed.   HPI: 1. HTN on lisinopril 10 mg qd she is not checking her BP daily and does not have machine  2. DM 2 no proteinuria checked lasb 05/08/2019 A1C 7.2 on metformin 500 mg bid she is working out  3. 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 She is walking intermittently with her friends. Weight is table  4. H/o breast cancer-she has appt 05/15/2019 [facility] s/p mastectomy but wants to have reconstruction re done for enhancement purposes 1 side different than the other She has appt with oncology upcoming June/july [facility] advised to disc fatigue with them as well  5. Vit D def vitamin D now normal and advised pt take 2000 IU D3otc qd  Reviewed labs from 05/08/2019 see note below   ROS: See pertinent positives and negatives per HPI. Stable weight, energy low Respiratory: no sob GI/abdomen: no GI blood in stool  Psych:increased stress  Neuro: denies h/a  Past Medical History:  Diagnosis Date  . Allergy   . Cancer [facility])    breast cancer dcis s/p double mastectomy   . Chicken pox   . Depression   . Diabetes mellitus without complication (HCC)    G9Q 7.7   . Endometriosis   . GERD (gastroesophageal reflux disease)   . Hx of migraines   . Hyperlipidemia   . Hypertension     Past Surgical History:  Procedure Laterality Date  . ABDOMINAL  HYSTERECTOMY    . Bilateral nipple sparing mastectomies     Dr Charlane Ferretti  . BREAST SURGERY     breast bx DCIS s/p double mastectomy and breast reconstruction   . [place], 2006, 2008; 2 miscarriages  . CHOLECYSTECTOMY    . LEEP     1996    Family History  Problem Relation Age of Onset  . Asthma Mother   . COPD Mother   . Depression Mother   . Diabetes Mother   . Early death Father   . Hypertension Sister   . Alcohol abuse Maternal Grandmother   . Cancer Maternal Grandmother        breast  . Alcohol abuse Maternal Grandfather   . Cancer Maternal Grandfather        lung  . Cancer Paternal Grandmother        brain   . Heart disease Paternal Grandfather   . COPD Sister   . Depression Sister     SOCIAL HX: married with kids    Current Outpatient Medications:  .  citalopram (CELEXA) 40 MG tablet, Take 1 tablet (40 mg total) by mouth daily., Disp: 90 tablet, Rfl: 3 .  lisinopril (ZESTRIL) 10 MG tablet, Take 1 tablet (10 mg total) by mouth daily., Disp: 90 tablet, Rfl: 3 .  metFORMIN (GLUCOPHAGE) 500 MG tablet, Take 1 tablet (500 mg total) by mouth 2 (  two) times daily with a meal., Disp: 180 tablet, Rfl: 3 .  rosuvastatin (CRESTOR) 5 MG tablet, Take 1 tablet (5 mg total) by mouth at bedtime., Disp: 90 tablet, Rfl: 3 .  valACYclovir (VALTREX) 1000 MG tablet, Take 1 tablet (1,000 mg total) by mouth 2 (two) times daily. X 3-7 days with outbreak as needed, Disp: 90 tablet, Rfl: 1  EXAM:  VITALS per patient if applicable:  GENERAL: alert, oriented, appears well and in no acute distress  HEENT: atraumatic, conjunttiva clear, no obvious abnormalities on inspection of external nose and ears  NECK: normal movements of the head and neck  LUNGS: on inspection no signs of respiratory distress, breathing rate appears normal, no obvious gross SOB, gasping or wheezing  CV: no obvious cyanosis  MS: moves all visible extremities without noticeable  abnormality  PSYCH/NEURO: pleasant and cooperative, no obvious depression or anxiety, speech and thought processing grossly intact  ASSESSMENT AND PLAN:  Discussed the following assessment and plan:  Type 2 diabetes mellitus without complication, without long-term current use of insulin (HCC) A1C 7.2 05/08/2019  -sch fasting labs 11/08/2019 CMET, CBC, A1c, lipid   metFORMIN (GLUCOPHAGE) 500 MG tablet, rosuvastatin (CRESTOR) 5 MG tablet -try to get records of eye exam [facility]  -urine normal 05/08/2019 neg proteinuria  -will do foot exam at f/u   Depression, unspecified depression type - Plan: citalopram (CELEXA) 40 MG tablet takes qhs  Monitor for now   Essential hypertension -  Cont meds check labs as above lisinopril (ZESTRIL) 10 MG tablet qhs  Hyperlipidemia, unspecified hyperlipidemia type - Plan: Lipid panel, rosuvastatin (CRESTOR) 5 MG tablet qhs  History of breast cancer 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 Phillip Heal saw 12/2018 due to see again 06/2019 had bx left lower leg  atypical mole and ln2 per pt   Dr. Dione Housekeeper eye doctor saw 10/2019 or 01/2019 need to get records [phone] Dr. Rodman Key dentist  Labs had 05/08/2019 [facility] Wbc 4.8 h/h 12.7/35.4 plts 275   Glucose 131 elevated Cr 1.05 GFR 74  A1C 7.2 stable   Na 141  K 4.1 Cl 103  Calcium 9.3  Total protein 9.3  Albumin 4.4  Total bilirubin 0.4  AST 28  ALT 32   TC 122 TG 98 HDL 41, LDL  61  Mumps low on MMR rec MMR  Urine protein normal   TSH 1.910, Ft4 1.24  Hep B 82.7  Vitamin D 82.9      I discussed the assessment and treatment plan with the patient. The patient was provided an opportunity to ask questions and all were answered. The patient agreed with the plan and demonstrated an understanding of the instructions.   The patient was advised to call back or seek an in-person evaluation if the symptoms worsen or if the condition fails to improve as anticipated.  Time spent 15 minutes  Delorise Jackson, MD

## 2019-05-14 NOTE — Progress Notes (Signed)
Patient doesn't have updates immunizations in [place]

## 2019-05-15 DIAGNOSIS — Z853 Personal history of malignant neoplasm of breast: Secondary | ICD-10-CM | POA: Diagnosis not present

## 2019-06-15 DIAGNOSIS — Z1159 Encounter for screening for other viral diseases: Secondary | ICD-10-CM | POA: Diagnosis not present

## 2019-06-15 DIAGNOSIS — Z01818 Encounter for other preprocedural examination: Secondary | ICD-10-CM | POA: Diagnosis not present

## 2019-06-18 DIAGNOSIS — E119 Type 2 diabetes mellitus without complications: Secondary | ICD-10-CM | POA: Diagnosis not present

## 2019-06-18 DIAGNOSIS — I10 Essential (primary) hypertension: Secondary | ICD-10-CM | POA: Diagnosis not present

## 2019-06-18 DIAGNOSIS — Z421 Encounter for breast reconstruction following mastectomy: Secondary | ICD-10-CM | POA: Diagnosis not present

## 2019-06-18 DIAGNOSIS — Z853 Personal history of malignant neoplasm of breast: Secondary | ICD-10-CM | POA: Diagnosis not present

## 2019-06-18 DIAGNOSIS — Z87891 Personal history of nicotine dependence: Secondary | ICD-10-CM | POA: Diagnosis not present

## 2019-06-18 DIAGNOSIS — Z9013 Acquired absence of bilateral breasts and nipples: Secondary | ICD-10-CM | POA: Diagnosis not present

## 2019-07-29 DIAGNOSIS — D0591 Unspecified type of carcinoma in situ of right breast: Secondary | ICD-10-CM | POA: Diagnosis not present

## 2019-08-28 ENCOUNTER — Other Ambulatory Visit: Payer: Self-pay

## 2019-08-28 DIAGNOSIS — Z20822 Contact with and (suspected) exposure to covid-19: Secondary | ICD-10-CM

## 2019-08-28 DIAGNOSIS — R6889 Other general symptoms and signs: Secondary | ICD-10-CM | POA: Diagnosis not present

## 2019-08-29 LAB — NOVEL CORONAVIRUS, NAA: SARS-CoV-2, NAA: NOT DETECTED

## 2019-09-11 ENCOUNTER — Other Ambulatory Visit: Payer: Self-pay

## 2019-09-11 ENCOUNTER — Ambulatory Visit (INDEPENDENT_AMBULATORY_CARE_PROVIDER_SITE_OTHER): Payer: BC Managed Care – PPO | Admitting: Family Medicine

## 2019-09-11 DIAGNOSIS — F329 Major depressive disorder, single episode, unspecified: Secondary | ICD-10-CM | POA: Diagnosis not present

## 2019-09-11 DIAGNOSIS — K219 Gastro-esophageal reflux disease without esophagitis: Secondary | ICD-10-CM | POA: Diagnosis not present

## 2019-09-11 DIAGNOSIS — F32A Depression, unspecified: Secondary | ICD-10-CM

## 2019-09-11 DIAGNOSIS — F419 Anxiety disorder, unspecified: Secondary | ICD-10-CM

## 2019-09-11 MED ORDER — CITALOPRAM HYDROBROMIDE 40 MG PO TABS: 40.0000 mg | ORAL_TABLET | Freq: Every day | ORAL | 3 refills | Status: DC

## 2019-09-11 MED ORDER — OMEPRAZOLE 20 MG PO CPDR: DELAYED_RELEASE_CAPSULE | ORAL | 1 refills | Status: DC

## 2019-09-11 MED ORDER — BUSPIRONE HCL 5 MG PO TABS: 5.0000 mg | ORAL_TABLET | Freq: Two times a day (BID) | ORAL | 1 refills | Status: DC

## 2019-09-11 NOTE — Progress Notes (Signed)
Patient ID: Sandra Griffin, female   DOB: 01-08-1971, 48 y.o.   MRN: [id number]    Virtual Visit via video Note  This visit type was conducted due to national recommendations for restrictions regarding the COVID-19 pandemic (e.g. social distancing).  This format is felt to be most appropriate for this patient at this time.  All issues noted in this document were discussed and addressed.  No physical exam was performed (except for noted visual exam findings with Video Visits).   I connected with Sandra Griffin today at  8:20 AM EDT by a video enabled telemedicine application and verified that I am speaking with the correct person using two identifiers. Location patient: home Location provider: work or home office Persons participating in the virtual visit: patient, provider  I discussed the limitations, risks, security and privacy concerns of performing an evaluation and management service by video and the availability of in person appointments. I also discussed with the patient that there may be a patient responsible charge related to this service. The patient expressed understanding and agreed to proceed.  HPI:  Patient and I connected via video to discuss complaints of worsening acid reflux and also increased anxiety.  Patient has been on antacid type medicine off and on for years due to heartburn.  Over the past month or so has noticed heartburn is worse.  Previously she had been on just omeprazole 20 mg/day, most recently has not been taking any sort of antacid.  Patient is on Celexa 40 mg for depression and anxiety.  States over the past many months with the pandemic, seems her depression and anxiety have gotten worse.  Stress level is at all-time high due to having to work from home also having to have her children to virtual school from home.  Denies SI or HI. Does not currently see a Social worker.    ROS: See pertinent positives and negatives per HPI.  Past Medical History:   Diagnosis Date  . Allergy   . Cancer [facility])    breast cancer dcis s/p double mastectomy   . Chicken pox   . Depression   . Diabetes mellitus without complication (HCC)    123XX123 7.7   . Endometriosis   . GERD (gastroesophageal reflux disease)   . Hx of migraines   . Hyperlipidemia   . Hypertension     Past Surgical History:  Procedure Laterality Date  . ABDOMINAL HYSTERECTOMY    . Bilateral nipple sparing mastectomies     Dr Charlane Ferretti  . BREAST SURGERY     breast bx DCIS s/p double mastectomy and breast reconstruction   . [place], 2006, 2008; 2 miscarriages  . CHOLECYSTECTOMY    . LEEP     1996    Family History  Problem Relation Age of Onset  . Asthma Mother   . COPD Mother   . Depression Mother   . Diabetes Mother   . Early death Father   . Hypertension Sister   . Alcohol abuse Maternal Grandmother   . Cancer Maternal Grandmother        breast  . Alcohol abuse Maternal Grandfather   . Cancer Maternal Grandfather        lung  . Cancer Paternal Grandmother        brain   . Heart disease Paternal Grandfather   . COPD Sister   . Depression Sister    Social History   Tobacco Use  . Smoking status: Former  Smoker  . Smokeless tobacco: Never Used  . Tobacco comment: former x 7 years max 1 ppd   Substance Use Topics  . Alcohol use: Yes    Current Outpatient Medications:  .  citalopram (CELEXA) 40 MG tablet, Take 1 tablet (40 mg total) by mouth daily., Disp: 90 tablet, Rfl: 3 .  lisinopril (ZESTRIL) 10 MG tablet, Take 1 tablet (10 mg total) by mouth daily., Disp: 90 tablet, Rfl: 3 .  metFORMIN (GLUCOPHAGE) 500 MG tablet, Take 1 tablet (500 mg total) by mouth 2 (two) times daily with a meal., Disp: 180 tablet, Rfl: 3 .  rosuvastatin (CRESTOR) 5 MG tablet, Take 1 tablet (5 mg total) by mouth at bedtime., Disp: 90 tablet, Rfl: 3 .  valACYclovir (VALTREX) 1000 MG tablet, Take 1 tablet (1,000 mg total) by mouth 2 (two) times daily. X 3-7 days with  outbreak as needed, Disp: 90 tablet, Rfl: 1  EXAM:  GENERAL: alert, oriented, appears well and in no acute distress  HEENT: atraumatic, conjunttiva clear, no obvious abnormalities on inspection of external nose and ears  NECK: normal movements of the head and neck  LUNGS: on inspection no signs of respiratory distress, breathing rate appears normal, no obvious gross SOB, gasping or wheezing  CV: no obvious cyanosis  MS: moves all visible extremities without noticeable abnormality  PSYCH/NEURO: pleasant and cooperative, no obvious depression or anxiety, speech and thought processing grossly intact  ASSESSMENT AND PLAN:  Discussed the following assessment and plan:  1. Gastroesophageal reflux disease, esophagitis presence not specified Resume Prilosec at 40 mg per day for 2 weeks, then 20 mg per day. Reduce acidic foods, fatty foods in diet and increase water intake   - omeprazole (PRILOSEC) 20 MG capsule; Take 1 capsule 2 times a day with meals for 2 weeks, then take 1 capsule daily there after  Dispense: 60 capsule; Refill: 1  2. Depression, unspecified depression type Continue celexa, add buspar BID  - citalopram (CELEXA) 40 MG tablet; Take 1 tablet (40 mg total) by mouth daily.  Dispense: 90 tablet; Refill: 3 - busPIRone (BUSPAR) 5 MG tablet; Take 1 tablet (5 mg total) by mouth 2 (two) times daily.  Dispense: 60 tablet; Refill: 1  3. Anxiety Offered referral to counselor, states she will think about.  - busPIRone (BUSPAR) 5 MG tablet; Take 1 tablet (5 mg total) by mouth 2 (two) times daily.  Dispense: 60 tablet; Refill: 1  I discussed the assessment and treatment plan with the patient. The patient was provided an opportunity to ask questions and all were answered. The patient agreed with the plan and demonstrated an understanding of the instructions.   The patient was advised to call back or seek an in-person evaluation if the symptoms worsen or if the condition fails to  improve as anticipated.  I provided 25 minutes of video-face-to-face time during this encounter discussing medications, strategy to reduce anxiety/improve depression   Jodelle Green, FNP

## 2019-11-17 ENCOUNTER — Other Ambulatory Visit: Payer: Self-pay

## 2019-11-17 ENCOUNTER — Ambulatory Visit: Payer: BLUE CROSS/BLUE SHIELD | Admitting: Internal Medicine

## 2019-11-17 ENCOUNTER — Other Ambulatory Visit: Payer: Self-pay | Admitting: Internal Medicine

## 2019-11-17 ENCOUNTER — Telehealth: Payer: Self-pay | Admitting: Internal Medicine

## 2019-11-17 DIAGNOSIS — F32A Depression, unspecified: Secondary | ICD-10-CM

## 2019-11-17 DIAGNOSIS — F329 Major depressive disorder, single episode, unspecified: Secondary | ICD-10-CM

## 2019-11-17 DIAGNOSIS — E785 Hyperlipidemia, unspecified: Secondary | ICD-10-CM

## 2019-11-17 DIAGNOSIS — E119 Type 2 diabetes mellitus without complications: Secondary | ICD-10-CM

## 2019-11-17 DIAGNOSIS — F419 Anxiety disorder, unspecified: Secondary | ICD-10-CM

## 2019-11-17 MED ORDER — ROSUVASTATIN CALCIUM 5 MG PO TABS: 5.0000 mg | ORAL_TABLET | Freq: Every day | ORAL | 3 refills | Status: DC

## 2019-11-17 MED ORDER — METFORMIN HCL 500 MG PO TABS: 500.0000 mg | ORAL_TABLET | Freq: Two times a day (BID) | ORAL | 3 refills | Status: DC

## 2019-11-17 MED ORDER — BUSPIRONE HCL 5 MG PO TABS: 5.0000 mg | ORAL_TABLET | Freq: Two times a day (BID) | ORAL | 1 refills | Status: DC

## 2019-11-17 NOTE — Telephone Encounter (Signed)
Labs 11/16/19 [facility]   Sugar elevated A1C 7.1=diabetes goal is <7.0 rec healthy diet and exercise  Liver kidneys normal   Cholesterol total 113, Trigs 90, HDL 44, LDL 52  TSH normal 2.1 T4 6.9  T3 25   Blood cts normal   Vitamin D 37.3 normal but low normal would rec D3 2000 Iu daily otc  [place]

## 2019-11-18 NOTE — Telephone Encounter (Signed)
Patient was informed of results.  Patient understood and no questions, comments, or concerns at this time.  

## 2019-11-20 ENCOUNTER — Other Ambulatory Visit: Payer: Self-pay

## 2019-11-20 DIAGNOSIS — Z20822 Contact with and (suspected) exposure to covid-19: Secondary | ICD-10-CM

## 2019-11-23 LAB — NOVEL CORONAVIRUS, NAA: SARS-CoV-2, NAA: NOT DETECTED

## 2019-11-30 DIAGNOSIS — Z01818 Encounter for other preprocedural examination: Secondary | ICD-10-CM | POA: Diagnosis not present

## 2019-12-03 DIAGNOSIS — Z853 Personal history of malignant neoplasm of breast: Secondary | ICD-10-CM | POA: Diagnosis not present

## 2019-12-03 DIAGNOSIS — N650 Deformity of reconstructed breast: Secondary | ICD-10-CM | POA: Diagnosis not present

## 2019-12-03 DIAGNOSIS — Z87891 Personal history of nicotine dependence: Secondary | ICD-10-CM | POA: Diagnosis not present

## 2019-12-03 DIAGNOSIS — I10 Essential (primary) hypertension: Secondary | ICD-10-CM | POA: Diagnosis not present

## 2019-12-03 DIAGNOSIS — E119 Type 2 diabetes mellitus without complications: Secondary | ICD-10-CM | POA: Diagnosis not present

## 2020-01-13 ENCOUNTER — Ambulatory Visit: Payer: BC Managed Care – PPO | Admitting: Internal Medicine

## 2020-01-15 ENCOUNTER — Ambulatory Visit: Payer: BC Managed Care – PPO | Admitting: Internal Medicine

## 2020-02-04 ENCOUNTER — Other Ambulatory Visit: Payer: Self-pay | Admitting: Internal Medicine

## 2020-02-04 DIAGNOSIS — F329 Major depressive disorder, single episode, unspecified: Secondary | ICD-10-CM

## 2020-02-04 DIAGNOSIS — K219 Gastro-esophageal reflux disease without esophagitis: Secondary | ICD-10-CM

## 2020-02-04 DIAGNOSIS — F32A Depression, unspecified: Secondary | ICD-10-CM

## 2020-02-04 DIAGNOSIS — I10 Essential (primary) hypertension: Secondary | ICD-10-CM

## 2020-02-04 MED ORDER — CITALOPRAM HYDROBROMIDE 40 MG PO TABS: 40.0000 mg | ORAL_TABLET | Freq: Every day | ORAL | 3 refills | Status: DC

## 2020-02-04 MED ORDER — OMEPRAZOLE 20 MG PO CPDR: 20.0000 mg | DELAYED_RELEASE_CAPSULE | Freq: Every day | ORAL | 3 refills | Status: DC

## 2020-02-04 MED ORDER — LISINOPRIL 10 MG PO TABS: 10.0000 mg | ORAL_TABLET | Freq: Every day | ORAL | 3 refills | Status: DC

## 2020-03-07 ENCOUNTER — Other Ambulatory Visit: Payer: Self-pay

## 2020-03-09 ENCOUNTER — Ambulatory Visit (INDEPENDENT_AMBULATORY_CARE_PROVIDER_SITE_OTHER): Payer: BC Managed Care – PPO | Admitting: Internal Medicine

## 2020-03-09 ENCOUNTER — Other Ambulatory Visit: Payer: Self-pay

## 2020-03-09 ENCOUNTER — Encounter: Payer: Self-pay | Admitting: Internal Medicine

## 2020-03-09 VITALS — BP 124/80 | HR 78 | Temp 97.8°F | Ht 62.0 in | Wt 177.4 lb

## 2020-03-09 DIAGNOSIS — N644 Mastodynia: Secondary | ICD-10-CM

## 2020-03-09 DIAGNOSIS — M25511 Pain in right shoulder: Secondary | ICD-10-CM

## 2020-03-09 DIAGNOSIS — E119 Type 2 diabetes mellitus without complications: Secondary | ICD-10-CM

## 2020-03-09 DIAGNOSIS — B009 Herpesviral infection, unspecified: Secondary | ICD-10-CM

## 2020-03-09 DIAGNOSIS — Z1211 Encounter for screening for malignant neoplasm of colon: Secondary | ICD-10-CM

## 2020-03-09 DIAGNOSIS — M542 Cervicalgia: Secondary | ICD-10-CM

## 2020-03-09 MED ORDER — VALACYCLOVIR HCL 1 G PO TABS: 1000.0000 mg | ORAL_TABLET | Freq: Two times a day (BID) | ORAL | 3 refills | Status: DC

## 2020-03-09 NOTE — Patient Instructions (Addendum)
COVID-19 Vaccine Information can be found at: ShippingScam.co.uk For questions related to vaccine distribution or appointments, please email [email or web address] or call [phone].   [facility] covid 19 vaccine   Call Dr. Phillip Heal dermatology  Call Dr. Myer [facility])  [address], [place] [address]  [phone]  Karie Georges, [place]  [place], [place] [address]  [phone]  [phone] (Fax)  Carcinoma in situ of right breast, unspecified type (Primary Dx)   Social History - documented as of this encounter   Call [facility] 04/2020  Dr. Tami Lin GI  Dr. Normajean Baxter ortho  Shoulder Range of Motion Exercises Shoulder range of motion (ROM) exercises are done to keep the shoulder moving freely or to increase movement. They are often recommended for people who have shoulder pain or stiffness or who are recovering from a shoulder surgery. Phase 1 exercises When you are able, do this exercise 1-2 times per day for 30-60 seconds in each direction, or as directed by your health care provider. Pendulum exercise To do this exercise while sitting: 1. Sit in a chair or at the edge of your bed with your feet flat on the floor. 2. Let your affected arm hang down in front of you over the edge of the bed or chair. 3. Relax your shoulder, arm, and hand. [place] your body so your arm gently swings in small circles. You can also use your unaffected arm to start the motion. 5. Repeat changing the direction of the circles, swinging your arm left and right, and swinging your arm forward and back. To do this exercise while standing: 1. Stand next to a sturdy chair or table, and hold on to it with your hand on your unaffected side. 2. Bend forward at the waist. 3. Bend your knees slightly. 4. Relax your shoulder, arm,  and hand. 5. While keeping your shoulder relaxed, use body motion to swing your arm in small circles. 6. Repeat changing the direction of the circles, swinging your arm left and right, and swinging your arm forward and back. 7. Between exercises, stand up tall and take a short break to relax your lower back.  Phase 2 exercises Do these exercises 1-2 times per day or as told by your health care provider. Hold each stretch for 30 seconds, and repeat 3 times. Do the exercises with one or both arms as instructed by your health care provider. For these exercises, sit at a table with your hand and arm supported by the table. A chair that slides easily or has wheels can be helpful. External rotation 1. Turn your chair so that your affected side is nearest to the table. 2. Place your forearm on the table to your side. Bend your elbow about 90 at the elbow (right angle) and place your hand palm facing down on the table. Your elbow should be about 6 inches away from your side. 3. Keeping your arm on the table, lean your body forward. Abduction 1. Turn your chair so that your affected side is nearest to the table. 2. Place your forearm and hand on the table so that your thumb points toward the ceiling and your arm is straight out to your side. 3. Slide your hand out to the side and away from you, using your unaffected arm to do the work. 4. To increase the stretch, you can slide your chair away from the table. Flexion: forward stretch 1.  Sit facing the table. Place your hand and elbow on the table in front of you. 2. Slide your hand forward and away from you, using your unaffected arm to do the work. 3. To increase the stretch, you can slide your chair backward. Phase 3 exercises Do these exercises 1-2 times per day or as told by your health care provider. Hold each stretch for 30 seconds, and repeat 3 times. Do the exercises with one or both arms as instructed by your health care provider. Cross-body  stretch: posterior capsule stretch 1. Lift your arm straight out in front of you. 2. Bend your arm 90 at the elbow (right angle) so your forearm moves across your body. 3. Use your other arm to gently pull the elbow across your body, toward your other shoulder. Wall climbs 1. Stand with your affected arm extended out to the side with your hand resting on a door frame. 2. Slide your hand slowly up the door frame. 3. To increase the stretch, step through the door frame. Keep your body upright and do not lean. Wand exercises You will need a cane, a piece of PVC pipe, or a sturdy wooden dowel for wand exercises. Flexion To do this exercise while standing: 1. Hold the wand with both of your hands, palms down. 2. Using the other arm to help, lift your arms up and over your head, if able. 3. Push upward with your other arm to gently increase the stretch. To do this exercise while lying down: 1. Lie on your back with your elbows resting on the floor and the wand in both your hands. Your hands will be palm down, or pointing toward your feet. 2. Lift your hands toward the ceiling, using your unaffected arm to help if needed. 3. Bring your arms overhead as able, using your unaffected arm to help if needed. Internal rotation 1. Stand while holding the wand behind you with both hands. Your unaffected arm should be extended above your head with the arm of the affected side extended behind you at the level of your waist. The wand should be pointing straight up and down as you hold it. 2. Slowly pull the wand up behind your back by straightening the elbow of your unaffected arm and bending the elbow of your affected arm. External rotation 1. Lie on your back with your affected upper arm supported on a small pillow or rolled towel. When you first do this exercise, keep your upper arm close to your body. Over time, bring your arm up to a 90 angle out to the side. 2. Hold the wand across your stomach and with  both hands palm up. Your elbow on your affected side should be bent at a 90 angle. 3. Use your unaffected side to help push your forearm away from you and toward the floor. Keep your elbow on your affected side bent at a 90 angle. Contact a health care provider if you have:  New or increasing pain.  New numbness, tingling, weakness, or discoloration in your arm or hand. This information is not intended to replace advice given to you by your health care provider. Make sure you discuss any questions you have with your health care provider. Document Revised: 01/29/2018 Document Reviewed: 01/29/2018 Elsevier Patient Education  [place].  Shoulder Exercises Ask your health care provider which exercises are safe for you. Do exercises exactly as told by your health care provider and adjust them as directed. It is normal to feel  mild stretching, pulling, tightness, or discomfort as you do these exercises. Stop right away if you feel sudden pain or your pain gets worse. Do not begin these exercises until told by your health care provider. Stretching exercises External rotation and abduction This exercise is sometimes called corner stretch. This exercise rotates your arm outward (external rotation) and moves your arm out from your body (abduction). 1. Stand in a doorway with one of your feet slightly in front of the other. This is called a staggered stance. If you cannot reach your forearms to the door frame, stand facing a corner of a room. 2. Choose one of the following positions as told by your health care provider: ? Place your hands and forearms on the door frame above your head. ? Place your hands and forearms on the door frame at the height of your head. ? Place your hands on the door frame at the height of your elbows. 3. Slowly move your weight onto your front foot until you feel a stretch across your chest and in the front of your shoulders. Keep your head and chest upright and keep  your abdominal muscles tight. 4. Hold for __________ seconds. 5. To release the stretch, shift your weight to your back foot. Repeat __________ times. Complete this exercise __________ times a day. Extension, standing 1. Stand and hold a broomstick, a cane, or a similar object behind your back. ? Your hands should be a little wider than shoulder width apart. ? Your palms should face away from your back. 2. Keeping your elbows straight and your shoulder muscles relaxed, move the stick away from your body until you feel a stretch in your shoulders (extension). ? Avoid shrugging your shoulders while you move the stick. Keep your shoulder blades tucked down toward the middle of your back. 3. Hold for __________ seconds. 4. Slowly return to the starting position. Repeat __________ times. Complete this exercise __________ times a day. Range-of-motion exercises Pendulum  1. Stand near a wall or a surface that you can hold onto for balance. 2. Bend at the waist and let your left / right arm hang straight down. Use your other arm to support you. Keep your back straight and do not lock your knees. 3. Relax your left / right arm and shoulder muscles, and move your hips and your trunk so your left / right arm swings freely. Your arm should swing because of the motion of your body, not because you are using your arm or shoulder muscles. 4. Keep moving your hips and trunk so your arm swings in the following directions, as told by your health care provider: ? Side to side. ? Forward and backward. ? In clockwise and counterclockwise circles. 5. Continue each motion for __________ seconds, or for as long as told by your health care provider. 6. Slowly return to the starting position. Repeat __________ times. Complete this exercise __________ times a day. Shoulder flexion, standing  1. Stand and hold a broomstick, a cane, or a similar object. Place your hands a little more than shoulder width apart on the  object. Your left / right hand should be palm up, and your other hand should be palm down. 2. Keep your elbow straight and your shoulder muscles relaxed. Push the stick up with your healthy arm to raise your left / right arm in front of your body, and then over your head until you feel a stretch in your shoulder (flexion). ? Avoid shrugging your shoulder while you  raise your arm. Keep your shoulder blade tucked down toward the middle of your back. 3. Hold for __________ seconds. 4. Slowly return to the starting position. Repeat __________ times. Complete this exercise __________ times a day. Shoulder abduction, standing 1. Stand and hold a broomstick, a cane, or a similar object. Place your hands a little more than shoulder width apart on the object. Your left / right hand should be palm up, and your other hand should be palm down. 2. Keep your elbow straight and your shoulder muscles relaxed. Push the object across your body toward your left / right side. Raise your left / right arm to the side of your body (abduction) until you feel a stretch in your shoulder. ? Do not raise your arm above shoulder height unless your health care provider tells you to do that. ? If directed, raise your arm over your head. ? Avoid shrugging your shoulder while you raise your arm. Keep your shoulder blade tucked down toward the middle of your back. 3. Hold for __________ seconds. 4. Slowly return to the starting position. Repeat __________ times. Complete this exercise __________ times a day. Internal rotation  1. Place your left / right hand behind your back, palm up. 2. Use your other hand to dangle an exercise band, a towel, or a similar object over your shoulder. Grasp the band with your left / right hand so you are holding on to both ends. 3. Gently pull up on the band until you feel a stretch in the front of your left / right shoulder. The movement of your arm toward the center of your body is called internal  rotation. ? Avoid shrugging your shoulder while you raise your arm. Keep your shoulder blade tucked down toward the middle of your back. 4. Hold for __________ seconds. 5. Release the stretch by letting go of the band and lowering your hands. Repeat __________ times. Complete this exercise __________ times a day. Strengthening exercises External rotation  1. Sit in a stable chair without armrests. 2. Secure an exercise band to a stable object at elbow height on your left / right side. 3. Place a soft object, such as a folded towel or a small pillow, between your left / right upper arm and your body to move your elbow about 4 inches (10 cm) away from your side. 4. Hold the end of the exercise band so it is tight and there is no slack. 5. Keeping your elbow pressed against the soft object, slowly move your forearm out, away from your abdomen (external rotation). Keep your body steady so only your forearm moves. 6. Hold for __________ seconds. 7. Slowly return to the starting position. Repeat __________ times. Complete this exercise __________ times a day. Shoulder abduction  1. Sit in a stable chair without armrests, or stand up. 2. Hold a __________ weight in your left / right hand, or hold an exercise band with both hands. 3. Start with your arms straight down and your left / right palm facing in, toward your body. 4. Slowly lift your left / right hand out to your side (abduction). Do not lift your hand above shoulder height unless your health care provider tells you that this is safe. ? Keep your arms straight. ? Avoid shrugging your shoulder while you do this movement. Keep your shoulder blade tucked down toward the middle of your back. 5. Hold for __________ seconds. 6. Slowly lower your arm, and return to the starting position. Repeat __________  times. Complete this exercise __________ times a day. Shoulder extension 1. Sit in a stable chair without armrests, or stand up. 2. Secure  an exercise band to a stable object in front of you so it is at shoulder height. 3. Hold one end of the exercise band in each hand. Your palms should face each other. 4. Straighten your elbows and lift your hands up to shoulder height. 5. Step back, away from the secured end of the exercise band, until the band is tight and there is no slack. 6. Squeeze your shoulder blades together as you pull your hands down to the sides of your thighs (extension). Stop when your hands are straight down by your sides. Do not let your hands go behind your body. 7. Hold for __________ seconds. 8. Slowly return to the starting position. Repeat __________ times. Complete this exercise __________ times a day. Shoulder row 1. Sit in a stable chair without armrests, or stand up. 2. Secure an exercise band to a stable object in front of you so it is at waist height. 3. Hold one end of the exercise band in each hand. Position your palms so that your thumbs are facing the ceiling (neutral position). 4. Bend each of your elbows to a 90-degree angle (right angle) and keep your upper arms at your sides. 5. Step back until the band is tight and there is no slack. 6. Slowly pull your elbows back behind you. 7. Hold for __________ seconds. 8. Slowly return to the starting position. Repeat __________ times. Complete this exercise __________ times a day. Shoulder press-ups  1. Sit in a stable chair that has armrests. Sit upright, with your feet flat on the floor. 2. Put your hands on the armrests so your elbows are bent and your fingers are pointing forward. Your hands should be about even with the sides of your body. 3. Push down on the armrests and use your arms to lift yourself off the chair. Straighten your elbows and lift yourself up as much as you comfortably can. ? Move your shoulder blades down, and avoid letting your shoulders move up toward your ears. ? Keep your feet on the ground. As you get stronger, your feet  should support less of your body weight as you lift yourself up. 4. Hold for __________ seconds. 5. Slowly lower yourself back into the chair. Repeat __________ times. Complete this exercise __________ times a day. Wall push-ups  1. Stand so you are facing a stable wall. Your feet should be about one arm-length away from the wall. 2. Lean forward and place your palms on the wall at shoulder height. 3. Keep your feet flat on the floor as you bend your elbows and lean forward toward the wall. 4. Hold for __________ seconds. 5. Straighten your elbows to push yourself back to the starting position. Repeat __________ times. Complete this exercise __________ times a day. This information is not intended to replace advice given to you by your health care provider. Make sure you discuss any questions you have with your health care provider. Document Revised: 04/10/2019 Document Reviewed: 01/16/2019 Elsevier Patient Education  [place].

## 2020-03-09 NOTE — Progress Notes (Signed)
Chief Complaint  Patient presents with  . Follow-up  . Shoulder Pain    On and off for the past few months. on right side. Including some right hand numbness in the mornings   F/u  1. Right shoulder pain x 2 months and pain with ROM no trauma or heavy lifting she is right handed and pain comes and goes. She she is asleep she feels the pain as she lies on her side. Shoulder catches at times and at the top of shoulder 5/10. Pain occurs with overhead and reaching back movements  Also c/o right shoulder blade pain x years.   2. C/o right nipple increased sensitivity and does not have left nipple due to h/o breast cancer breast surgeon in [place] advised pt to call for sooner appt than 06/2020  [facility])  [place] [address] [place] [address]  [phone]  Karie Georges, [place]  [place], [place] [address]  [phone]  [phone] (Fax)   3. DM 2 A1C 7.1 11/16/2019 on metformin 500 bid and lis 10 mg qd and crestor 5 4. Left face HSV needs refill of valtrex   Review of Systems  Constitutional: Negative for weight loss.  HENT: Negative for hearing loss.   Eyes: Negative for blurred vision.  Cardiovascular: Negative for chest pain.  Gastrointestinal: Negative for abdominal pain.  Musculoskeletal: Positive for joint pain.  Skin: Positive for rash.  Psychiatric/Behavioral: Negative for depression.   Past Medical History:  Diagnosis Date  . Allergy   . Cancer [facility])    breast cancer dcis s/p double mastectomy   . Chicken pox   . Depression   . Diabetes mellitus without complication (HCC)    B0W 7.7   . Endometriosis   . GERD (gastroesophageal reflux disease)   . Hx of migraines   . Hyperlipidemia   . Hypertension    Past Surgical History:  Procedure Laterality Date  . ABDOMINAL HYSTERECTOMY    . Bilateral nipple sparing mastectomies     Dr Charlane Ferretti  . BREAST SURGERY     breast bx DCIS s/p double mastectomy and breast reconstruction   . [place], 2006, 2008; 2 miscarriages  . CHOLECYSTECTOMY    . LEEP     1996   Family History  Problem Relation Age of Onset  . Asthma Mother   . COPD Mother   . Depression Mother   . Diabetes Mother   . Early death Father   . Hypertension Sister   . Alcohol abuse Maternal Grandmother   . Cancer Maternal Grandmother        breast  . Alcohol abuse Maternal Grandfather   . Cancer Maternal Grandfather        lung  . Cancer Paternal Grandmother        brain   . Heart disease Paternal Grandfather   . COPD Sister   . Depression Sister    Social History   Socioeconomic History  . Marital status: Married    Spouse name: Not on file  . Number of children: Not on file  . Years of education: Not on file  . Highest education level: Not on file  Occupational History  . Not on file  Tobacco Use  . Smoking status: Former Research scientist (life sciences)  . Smokeless tobacco: Never Used  . Tobacco comment: former x 7 years max 1 ppd   Substance and Sexual Activity  . Alcohol  use: Yes  . Drug use: Not Currently  . Sexual activity: Yes    Comment: men  Other Topics Concern  . Not on file  Social History Narrative   Married with kids    Some college    Customer service rep    Owns guns, wears seat belt, safe in relationship    Social Determinants of Health   Financial Resource Strain:   . Difficulty of Paying Living Expenses:   Food Insecurity:   . Worried About Charity fundraiser in the Last Year:   . Arboriculturist in the Last Year:   Transportation Needs:   . Film/video editor (Medical):   Marland Kitchen Lack of Transportation (Non-Medical):   Physical Activity:   . Days of Exercise per Week:   . Minutes of Exercise per Session:   Stress:   . Feeling of Stress :   Social Connections:   . Frequency of Communication with Friends and Family:   . Frequency of Social Gatherings with Friends and Family:   . Attends  Religious Services:   . Active Member of Clubs or Organizations:   . Attends Archivist Meetings:   Marland Kitchen Marital Status:   Intimate Partner Violence:   . Fear of Current or Ex-Partner:   . Emotionally Abused:   Marland Kitchen Physically Abused:   . Sexually Abused:    Current Meds  Medication Sig  . busPIRone (BUSPAR) 5 MG tablet Take 1 tablet (5 mg total) by mouth 2 (two) times daily.  . citalopram (CELEXA) 40 MG tablet Take 1 tablet (40 mg total) by mouth daily.  Marland Kitchen lisinopril (ZESTRIL) 10 MG tablet Take 1 tablet (10 mg total) by mouth daily.  . metFORMIN (GLUCOPHAGE) 500 MG tablet Take 1 tablet (500 mg total) by mouth 2 (two) times daily with a meal.  . omeprazole (PRILOSEC) 20 MG capsule Take 1 capsule (20 mg total) by mouth daily. 30 min. Before food  . rosuvastatin (CRESTOR) 5 MG tablet Take 1 tablet (5 mg total) by mouth at bedtime.  . valACYclovir (VALTREX) 1000 MG tablet Take 1 tablet (1,000 mg total) by mouth 2 (two) times daily. X 3-7 days with outbreak as needed  . [DISCONTINUED] valACYclovir (VALTREX) 1000 MG tablet Take 1 tablet (1,000 mg total) by mouth 2 (two) times daily. X 3-7 days with outbreak as needed   No Known Allergies No results found for this or any previous visit (from the past 2160 hour(s)). Objective  Body mass index is 32.45 kg/m. Wt Readings from Last 3 Encounters:  03/09/20 177 lb 6.4 oz (80.5 kg)  02/12/19 185 lb 12.8 oz (84.3 kg)   Temp Readings from Last 3 Encounters:  03/09/20 97.8 F (36.6 C) (Temporal)  02/12/19 98.2 F (36.8 C)   BP Readings from Last 3 Encounters:  03/09/20 124/80  02/12/19 126/84   Pulse Readings from Last 3 Encounters:  03/09/20 78  02/12/19 80    Physical Exam Vitals and nursing note reviewed.  Constitutional:      Appearance: Normal appearance. She is well-developed and well-groomed. She is obese.  HENT:     Head: Normocephalic and atraumatic.  Eyes:     Conjunctiva/sclera: Conjunctivae normal.     Pupils:  Pupils are equal, round, and reactive to light.  Cardiovascular:     Rate and Rhythm: Normal rate and regular rhythm.     Heart sounds: Normal heart sounds. No murmur.  Pulmonary:     Effort: Pulmonary effort  is normal.     Breath sounds: Normal breath sounds.  Chest:    Musculoskeletal:     Right shoulder: Tenderness present.       Arms:     Comments: Pain with "ROM"  Skin:    General: Skin is warm and dry.  Neurological:     General: No focal deficit present.     Mental Status: She is alert and oriented to person, place, and time. Mental status is at baseline.     Gait: Gait normal.  Psychiatric:        Attention and Perception: Attention and perception normal.        Mood and Affect: Mood and affect normal.        Speech: Speech normal.        Behavior: Behavior normal. Behavior is cooperative.        Thought Content: Thought content normal.        Cognition and Memory: Cognition and memory normal.        Judgment: Judgment normal.     Assessment  Plan  Acute pain of right shoulder - Plan: DG Shoulder Right, Ambulatory referral to Orthopedic Surgery Dr. Raliegh Ip  Cervicalgia - Plan: DG Cervical Spine Complete  Herpes - Plan: valACYclovir (VALTREX) 1000 MG tablet  Type 2 diabetes mellitus without complication, without long-term current use of insulin ([place]) - Plan: Comprehensive metabolic panel, CBC with Differential/Platelet, Lipid panel, Hemoglobin A1c, Urinalysis, Routine w reflex microscopic, Microalbumin / creatinine urine ratio Cont meds  Ask about eye exam in future  Do foot exam at f/u  Consider pna 23 vx   Nipple pain  rec sch f/u with surgery  [facility])  [address], [place] [address]  [phone]  Karie Georges, [place]  [place], [place] [address]  [phone]  [phone] (Fax)   HM Flu shot 10/2019 utd   Tdap in 2016 per pt consider in  2026 Consider pna 23 vaccine in the future  Consider mmr given MMR Rx today  Hep b immune Given info about covid 19 vaccine   S/p mastectomy for breast cancer h/o with [facility] Dr. Charlane Ferretti  Pap 05/06/15 negative pap s/p hysterectomy q5 years had [facility] h/o abnormal pap h/o LEEP 1996  -advised pt call due f/u 04/2020   Colonoscopy age 47-49 Dr. Alice Reichert referred today     Derm Dr. Phillip Heal saw 12/2018 due to see again 06/2019 had bx left lower leg atypical mole and ln2 per pt  -call to schedule appt 2021   Dr. Dione Housekeeper eye doctor saw 10/2019 or 01/2019 need to get records [phone] Dr. Rodman Key dentist  Labs had 05/08/2019 [facility] Wbc 4.8 h/h 12.7/35.4 plts 275   Glucose 131 elevated Cr 1.05 GFR 74  A1C 7.2 stable   Na 141  K 4.1 Cl 103  Calcium 9.3  Total protein 9.3  Albumin 4.4  Total bilirubin 0.4  AST 28  ALT 32   TC 122 TG 98 HDL 41, LDL 61 Urine protein normal  TSH 1.910, Ft4 1.24  Hep B 82.7  Vitamin D 82.9   Provider: Dr. Olivia Mackie McLean-Scocuz[facility]

## 2020-03-10 DIAGNOSIS — B009 Herpesviral infection, unspecified: Secondary | ICD-10-CM | POA: Insufficient documentation

## 2020-03-10 DIAGNOSIS — N644 Mastodynia: Secondary | ICD-10-CM | POA: Insufficient documentation

## 2020-03-10 DIAGNOSIS — M25511 Pain in right shoulder: Secondary | ICD-10-CM | POA: Insufficient documentation

## 2020-03-10 HISTORY — DX: Herpesviral infection, unspecified: B00.9

## 2020-03-11 DIAGNOSIS — M7542 Impingement syndrome of left shoulder: Secondary | ICD-10-CM | POA: Diagnosis not present

## 2020-03-11 DIAGNOSIS — M7541 Impingement syndrome of right shoulder: Secondary | ICD-10-CM | POA: Diagnosis not present

## 2020-05-23 DIAGNOSIS — Z1211 Encounter for screening for malignant neoplasm of colon: Secondary | ICD-10-CM | POA: Diagnosis not present

## 2020-05-23 DIAGNOSIS — Z01818 Encounter for other preprocedural examination: Secondary | ICD-10-CM | POA: Diagnosis not present

## 2020-05-24 DIAGNOSIS — Z0189 Encounter for other specified special examinations: Secondary | ICD-10-CM | POA: Diagnosis not present

## 2020-05-26 ENCOUNTER — Telehealth: Payer: Self-pay | Admitting: Internal Medicine

## 2020-05-26 NOTE — Telephone Encounter (Signed)
[facility] labs 05/24/20  Glucose 117 high A1C 6.3 goal <7.0  BUN 11, Creatinine 0.76 normal GFR 94-109 normal kidney #s  Liver enzymes normal  Uric acid 4.2 normal   Total cholesterol 132 Triglycerides 154 slightly high  HDL 51 and  LDL 55  -mail cholesterol hanodut   TSh 1.820 normal, T4 6.9 normal   Blood cts WBC 4.3 H/H 11.6/35.6, platelets 265 normal   Vitamin D 34.7 rec D3 2000 to 4000 Iu daily otc    [place]

## 2020-05-27 NOTE — Telephone Encounter (Signed)
Patient informed and verbalized understanding.  Hand out mailed and scheduled for a follow up in office 09/22/20 at 8:30 am.

## 2020-06-01 DIAGNOSIS — D0511 Intraductal carcinoma in situ of right breast: Secondary | ICD-10-CM | POA: Diagnosis not present

## 2020-06-22 DIAGNOSIS — D0511 Intraductal carcinoma in situ of right breast: Secondary | ICD-10-CM | POA: Diagnosis not present

## 2020-06-22 DIAGNOSIS — R928 Other abnormal and inconclusive findings on diagnostic imaging of breast: Secondary | ICD-10-CM | POA: Diagnosis not present

## 2020-08-17 LAB — HM DIABETES EYE EXAM

## 2020-08-19 ENCOUNTER — Encounter: Payer: Self-pay | Admitting: Internal Medicine

## 2020-08-31 DIAGNOSIS — Z01818 Encounter for other preprocedural examination: Secondary | ICD-10-CM | POA: Diagnosis not present

## 2020-09-02 DIAGNOSIS — Z1211 Encounter for screening for malignant neoplasm of colon: Secondary | ICD-10-CM | POA: Diagnosis not present

## 2020-09-02 DIAGNOSIS — K640 First degree hemorrhoids: Secondary | ICD-10-CM | POA: Diagnosis not present

## 2020-09-02 DIAGNOSIS — K5730 Diverticulosis of large intestine without perforation or abscess without bleeding: Secondary | ICD-10-CM | POA: Diagnosis not present

## 2020-09-02 LAB — HM COLONOSCOPY

## 2020-09-22 ENCOUNTER — Ambulatory Visit: Payer: BC Managed Care – PPO | Admitting: Internal Medicine

## 2020-09-29 DIAGNOSIS — D0591 Unspecified type of carcinoma in situ of right breast: Secondary | ICD-10-CM | POA: Diagnosis not present

## 2020-10-14 ENCOUNTER — Ambulatory Visit: Payer: BC Managed Care – PPO | Admitting: Internal Medicine

## 2020-10-19 DIAGNOSIS — Z049 Encounter for examination and observation for unspecified reason: Secondary | ICD-10-CM | POA: Diagnosis not present

## 2020-11-03 ENCOUNTER — Ambulatory Visit (INDEPENDENT_AMBULATORY_CARE_PROVIDER_SITE_OTHER): Payer: BC Managed Care – PPO | Admitting: Internal Medicine

## 2020-11-03 ENCOUNTER — Encounter: Payer: Self-pay | Admitting: Internal Medicine

## 2020-11-03 ENCOUNTER — Other Ambulatory Visit: Payer: Self-pay

## 2020-11-03 VITALS — BP 116/86 | HR 99 | Temp 98.5°F | Ht 62.0 in | Wt 174.8 lb

## 2020-11-03 DIAGNOSIS — E669 Obesity, unspecified: Secondary | ICD-10-CM

## 2020-11-03 DIAGNOSIS — K219 Gastro-esophageal reflux disease without esophagitis: Secondary | ICD-10-CM

## 2020-11-03 DIAGNOSIS — R635 Abnormal weight gain: Secondary | ICD-10-CM

## 2020-11-03 DIAGNOSIS — Z0000 Encounter for general adult medical examination without abnormal findings: Secondary | ICD-10-CM | POA: Diagnosis not present

## 2020-11-03 DIAGNOSIS — E1159 Type 2 diabetes mellitus with other circulatory complications: Secondary | ICD-10-CM

## 2020-11-03 DIAGNOSIS — F32A Depression, unspecified: Secondary | ICD-10-CM

## 2020-11-03 DIAGNOSIS — F419 Anxiety disorder, unspecified: Secondary | ICD-10-CM

## 2020-11-03 DIAGNOSIS — I152 Hypertension secondary to endocrine disorders: Secondary | ICD-10-CM

## 2020-11-03 DIAGNOSIS — Z1211 Encounter for screening for malignant neoplasm of colon: Secondary | ICD-10-CM

## 2020-11-03 DIAGNOSIS — E785 Hyperlipidemia, unspecified: Secondary | ICD-10-CM

## 2020-11-03 DIAGNOSIS — E119 Type 2 diabetes mellitus without complications: Secondary | ICD-10-CM

## 2020-11-03 DIAGNOSIS — B009 Herpesviral infection, unspecified: Secondary | ICD-10-CM

## 2020-11-03 DIAGNOSIS — I10 Essential (primary) hypertension: Secondary | ICD-10-CM

## 2020-11-03 MED ORDER — PHENTERMINE HCL 37.5 MG PO TABS: 37.5000 mg | ORAL_TABLET | Freq: Every day | ORAL | 0 refills | Status: DC

## 2020-11-03 NOTE — Patient Instructions (Addendum)
optavia diet, noom, weight watchers  Consider booster moderna 11/2020 or 1/22   Return to clinic pneumonia 23 vaccine please   Call dermatology please and schedule appt for follow Dr.Graham   Goal weight 140s     Pneumococcal Vaccine, Polyvalent solution for injection What is this medicine? PNEUMOCOCCAL VACCINE, POLYVALENT (NEU mo KOK al vak SEEN, pol ee VEY luhnt) is a vaccine to prevent pneumococcus bacteria infection. These bacteria are a major cause of ear infections, Strep throat infections, and serious pneumonia, meningitis, or blood infections worldwide. These vaccines help the body to produce antibodies (protective substances) that help your body defend against these bacteria. This vaccine is recommended for people 25 years of age and older with health problems. It is also recommended for all adults over 66 years old. This vaccine will not treat an infection. This medicine may be used for other purposes; ask your health care provider or pharmacist if you have questions. COMMON BRAND NAME(S): Pneumovax 23 What should I tell my health care provider before I take this medicine? They need to know if you have any of these conditions:  bleeding problems  bone marrow or organ transplant  cancer, Hodgkin's disease  fever  infection  immune system problems  low platelet count in the blood  seizures  an unusual or allergic reaction to pneumococcal vaccine, diphtheria toxoid, other vaccines, latex, other medicines, foods, dyes, or preservatives  pregnant or trying to get pregnant  breast-feeding How should I use this medicine? This vaccine is for injection into a muscle or under the skin. It is given by a health care professional. A copy of Vaccine Information Statements will be given before each vaccination. Read this sheet carefully each time. The sheet may change frequently. Talk to your pediatrician regarding the use of this medicine in children. While this drug may be  prescribed for children as young as 76 years of age for selected conditions, precautions do apply. Overdosage: If you think you have taken too much of this medicine contact a poison control center or emergency room at once. NOTE: This medicine is only for you. Do not share this medicine with others. What if I miss a dose? It is important not to miss your dose. Call your doctor or health care professional if you are unable to keep an appointment. What may interact with this medicine?  medicines for cancer chemotherapy  medicines that suppress your immune function  medicines that treat or prevent blood clots like warfarin, enoxaparin, and dalteparin  steroid medicines like prednisone or cortisone This list may not describe all possible interactions. Give your health care provider a list of all the medicines, herbs, non-prescription drugs, or dietary supplements you use. Also tell them if you smoke, drink alcohol, or use illegal drugs. Some items may interact with your medicine. What should I watch for while using this medicine? Mild fever and pain should go away in 3 days or less. Report any unusual symptoms to your doctor or health care professional. What side effects may I notice from receiving this medicine? Side effects that you should report to your doctor or health care professional as soon as possible:  allergic reactions like skin rash, itching or hives, swelling of the face, lips, or tongue  breathing problems  confused  fever over 102 degrees F  pain, tingling, numbness in the hands or feet  seizures  unusual bleeding or bruising  unusual muscle weakness Side effects that usually do not require medical attention (report to your  doctor or health care professional if they continue or are bothersome):  aches and pains  diarrhea  fever of 102 degrees F or less  headache  irritable  loss of appetite  pain, tender at site where injected  trouble sleeping This list  may not describe all possible side effects. Call your doctor for medical advice about side effects. You may report side effects to FDA at 1-800-FDA-1088. Where should I keep my medicine? This does not apply. This vaccine is given in a clinic, pharmacy, doctor's office, or other health care setting and will not be stored at home. NOTE: This sheet is a summary. It may not cover all possible information. If you have questions about this medicine, talk to your doctor, pharmacist, or health care provider.  2020 Elsevier/Gold Standard (2008-07-23 14:32:37)  Phentermine tablets or capsules What is this medicine? PHENTERMINE (FEN ter meen) decreases your appetite. It is used with a reduced calorie diet and exercise to help you lose weight. This medicine may be used for other purposes; ask your health care provider or pharmacist if you have questions. COMMON BRAND NAME(S): Adipex-P, Atti-Plex P, Atti-Plex P Spansule, Fastin, Lomaira, Pro-Fast, Tara-8 What should I tell my health care provider before I take this medicine? They need to know if you have any of these conditions:  agitation or nervousness  diabetes  glaucoma  heart disease  high blood pressure  history of drug abuse or addiction  history of stroke  kidney disease  lung disease called Primary Pulmonary Hypertension (PPH)  taken an MAOI like Carbex, Eldepryl, Marplan, Nardil, or Parnate in last 14 days  taking stimulant medicines for attention disorders, weight loss, or to stay awake  thyroid disease  an unusual or allergic reaction to phentermine, other medicines, foods, dyes, or preservatives  pregnant or trying to get pregnant  breast-feeding How should I use this medicine? Take this medicine by mouth with a glass of water. Follow the directions on the prescription label. Take your medicine at regular intervals. Do not take it more often than directed. Do not stop taking except on your doctor's advice. Talk to your  pediatrician regarding the use of this medicine in children. While this drug may be prescribed for children 17 years or older for selected conditions, precautions do apply. Overdosage: If you think you have taken too much of this medicine contact a poison control center or emergency room at once. NOTE: This medicine is only for you. Do not share this medicine with others. What if I miss a dose? If you miss a dose, take it as soon as you can. If it is almost time for your next dose, take only that dose. Do not take double or extra doses. What may interact with this medicine? Do not take this medicine with any of the following medications:  MAOIs like Carbex, Eldepryl, Marplan, Nardil, and Parnate This medicine may also interact with the following medications:  alcohol  certain medicines for depression, anxiety, or psychotic disorders  certain medicines for high blood pressure  linezolid  medicines for colds or breathing difficulties like pseudoephedrine or phenylephrine  medicines for diabetes  sibutramine  stimulant medicines for attention disorders, weight loss, or to stay awake This list may not describe all possible interactions. Give your health care provider a list of all the medicines, herbs, non-prescription drugs, or dietary supplements you use. Also tell them if you smoke, drink alcohol, or use illegal drugs. Some items may interact with your medicine. What should I  watch for while using this medicine? Visit your doctor or health care provider for regular checks on your progress. Do not stop taking except on your health care provider's advice. You may develop a severe reaction. Your health care provider will tell you how much medicine to take. Do not take this medicine close to bedtime. It may prevent you from sleeping. You may get drowsy or dizzy. Do not drive, use machinery, or do anything that needs mental alertness until you know how this medicine affects you. Do not stand  or sit up quickly, especially if you are an older patient. This reduces the risk of dizzy or fainting spells. Alcohol may increase dizziness and drowsiness. Avoid alcoholic drinks. This medicine may affect blood sugar levels. Ask your healthcare provider if changes in diet or medicines are needed if you have diabetes. Women should inform their health care provider if they wish to become pregnant or think they might be pregnant. Losing weight while pregnant is not advised and may cause harm to the unborn child. Talk to your health care provider for more information. What side effects may I notice from receiving this medicine? Side effects that you should report to your doctor or health care professional as soon as possible:  allergic reactions like skin rash, itching or hives, swelling of the face, lips, or tongue  breathing problems  changes in emotions or moods  changes in vision  chest pain or chest tightness  fast, irregular heartbeat  feeling faint or lightheaded  increased blood pressure  irritable  restlessness  tremors  seizures  signs and symptoms of a stroke like changes in vision; confusion; trouble speaking or understanding; severe headaches; sudden numbness or weakness of the face, arm or leg; trouble walking; dizziness; loss of balance or coordination  unusually weak or tired Side effects that usually do not require medical attention (report to your doctor or health care professional if they continue or are bothersome):  changes in taste  constipation or diarrhea  dizziness  dry mouth  headache  trouble sleeping  upset stomach This list may not describe all possible side effects. Call your doctor for medical advice about side effects. You may report side effects to FDA at 1-800-FDA-1088. Where should I keep my medicine? Keep out of the reach of children. This medicine can be abused. Keep your medicine in a safe place to protect it from theft. Do not  share this medicine with anyone. Selling or giving away this medicine is dangerous and against the law. This medicine may cause harm and death if it is taken by other adults, children, or pets. Return medicine that has not been used to an official disposal site. Contact the DEA at 318-437-5078 or your city/county government to find a site. If you cannot return the medicine, mix any unused medicine with a substance like cat litter or coffee grounds. Then throw the medicine away in a sealed container like a sealed bag or coffee can with a lid. Do not use the medicine after the expiration date. Store at room temperature between 20 and 25 degrees C (68 and 77 degrees F). Keep container tightly closed. NOTE: This sheet is a summary. It may not cover all possible information. If you have questions about this medicine, talk to your doctor, pharmacist, or health care provider.  2020 Elsevier/Gold Standard (2019-10-23 12:54:20)

## 2020-11-03 NOTE — Progress Notes (Signed)
Chief Complaint  Patient presents with  . Follow-up   Annual  1. Obesity exercising 40 min MWF and 6 months ago est with wt loss clinic and doing a shot weekly and adipex 37.5 mg qd and wants refills 2. Agreeable [facility] colonoscopy  3. HTN on lis 10 mg qd 4. DM 2 on metformin 500 mg bid     Review of Systems  Constitutional: Negative for weight loss.  HENT: Negative for hearing loss.   Eyes: Negative for blurred vision.  Respiratory: Negative for shortness of breath.   Cardiovascular: Negative for chest pain.  Gastrointestinal: Negative for abdominal pain.  Musculoskeletal: Negative for falls and joint pain.  Skin: Negative for rash.  Neurological: Negative for headaches.  Psychiatric/Behavioral: Negative for depression. The patient is not nervous/anxious.    Past Medical History:  Diagnosis Date  . Allergy   . [facility])    breast cancer dcis s/p double mastectomy   . Chicken pox   . Depression   . Diabetes mellitus without complication (HCC)    G9F 7.7   . Endometriosis   . GERD (gastroesophageal reflux disease)   . Hx of migraines   . Hyperlipidemia   . Hypertension    Past Surgical History:  Procedure Laterality Date  . ABDOMINAL HYSTERECTOMY    . Bilateral nipple sparing mastectomies     Dr Charlane Ferretti  . BREAST SURGERY     breast bx DCIS s/p double mastectomy and breast reconstruction   . [place], 2006, 2008; 2 miscarriages  . CHOLECYSTECTOMY    . LEEP     1996   Family History  Problem Relation Age of Onset  . Asthma Mother   . COPD Mother   . Depression Mother   . Diabetes Mother   . Early death Father   . Hypertension Sister   . Alcohol abuse Maternal Grandmother   . Cancer Maternal Grandmother        breast  . Alcohol abuse Maternal Grandfather   . Cancer Maternal Grandfather        lung  . Cancer Paternal Grandmother        brain   . Heart disease Paternal Grandfather   . COPD Sister   . Depression Sister    Social  History   Socioeconomic History  . Marital status: Married    Spouse name: Not on file  . Number of children: Not on file  . Years of education: Not on file  . Highest education level: Not on file  Occupational History  . Not on file  Tobacco Use  . Smoking status: Former Research scientist (life sciences)  . Smokeless tobacco: Never Used  . Tobacco comment: former x 7 years max 1 ppd   Substance and Sexual Activity  . Alcohol use: Yes  . Drug use: Not Currently  . Sexual activity: Yes    Comment: men  Other Topics Concern  . Not on file  Social History Narrative   Married with kids    Some college    Customer service rep    Owns guns, wears seat belt, safe in relationship    Works [employer] Strain:   . Difficulty of Paying Living Expenses: Not on file  Food Insecurity:   . Worried About Charity fundraiser in the Last Year: Not on file  . Ran Out of Food in the Last Year: Not on file  Transportation Needs:   . Film/video editor (Medical): Not on file  . Lack of Transportation (Non-Medical): Not on file  Physical Activity:   . Days of Exercise per Week: Not on file  . Minutes of Exercise per Session: Not on file  Stress:   . Feeling of Stress : Not on file  Social Connections:   . Frequency of Communication with Friends and Family: Not on file  . Frequency of Social Gatherings with Friends and Family: Not on file  . Attends Religious Services: Not on file  . Active Member of Clubs or Organizations: Not on file  . Attends Archivist Meetings: Not on file  . Marital Status: Not on file  Intimate Partner Violence:   . Fear of Current or Ex-Partner: Not on file  . Emotionally Abused: Not on file  . Physically Abused: Not on file  . Sexually Abused: Not on file   Current Meds  Medication Sig  . busPIRone (BUSPAR) 5 MG tablet Take 1 tablet (5 mg total) by mouth 2 (two) times daily.  . citalopram (CELEXA) 40 MG tablet Take  1 tablet (40 mg total) by mouth daily.  Marland Kitchen lisinopril (ZESTRIL) 10 MG tablet Take 1 tablet (10 mg total) by mouth daily.  . metFORMIN (GLUCOPHAGE) 500 MG tablet Take 1 tablet (500 mg total) by mouth 2 (two) times daily with a meal.  . omeprazole (PRILOSEC) 20 MG capsule Take 1 capsule (20 mg total) by mouth daily. 30 min. Before food  . rosuvastatin (CRESTOR) 5 MG tablet Take 1 tablet (5 mg total) by mouth at bedtime.  . valACYclovir (VALTREX) 1000 MG tablet Take 1 tablet (1,000 mg total) by mouth 2 (two) times daily. X 3-7 days with outbreak as needed  . [DISCONTINUED] busPIRone (BUSPAR) 5 MG tablet Take 1 tablet (5 mg total) by mouth 2 (two) times daily.  . [DISCONTINUED] citalopram (CELEXA) 40 MG tablet Take 1 tablet (40 mg total) by mouth daily.  . [DISCONTINUED] lisinopril (ZESTRIL) 10 MG tablet Take 1 tablet (10 mg total) by mouth daily.  . [DISCONTINUED] metFORMIN (GLUCOPHAGE) 500 MG tablet Take 1 tablet (500 mg total) by mouth 2 (two) times daily with a meal.  . [DISCONTINUED] omeprazole (PRILOSEC) 20 MG capsule Take 1 capsule (20 mg total) by mouth daily. 30 min. Before food  . [DISCONTINUED] rosuvastatin (CRESTOR) 5 MG tablet Take 1 tablet (5 mg total) by mouth at bedtime.  . [DISCONTINUED] valACYclovir (VALTREX) 1000 MG tablet Take 1 tablet (1,000 mg total) by mouth 2 (two) times daily. X 3-7 days with outbreak as needed   No Known Allergies Recent Results (from the past 2160 hour(s))  HM COLONOSCOPY     Status: None   Collection Time: 09/02/20 12:00 AM  Result Value Ref Range   HM Colonoscopy See Report (in chart) See Report (in chart), Patient Reported    Comment: Dr. Alice Reichert IH grade 1, divert.    Objective  Body mass index is 31.97 kg/m. Wt Readings from Last 3 Encounters:  11/03/20 174 lb 12.8 oz (79.3 kg)  03/09/20 177 lb 6.4 oz (80.5 kg)  02/12/19 185 lb 12.8 oz (84.3 kg)   Temp Readings from Last 3 Encounters:  11/03/20 98.5 F (36.9 C) (Oral)  03/09/20 97.8 F (36.6  C) (Temporal)  02/12/19 98.2 F (36.8 C)   BP Readings from Last 3 Encounters:  11/03/20 116/86  03/09/20 124/80  02/12/19 126/84   Pulse Readings from Last 3 Encounters:  11/03/20 99  03/09/20 78  02/12/19 80    Physical Exam Vitals and nursing note reviewed.  Constitutional:      Appearance: Normal appearance. She is well-developed and well-groomed. She is obese.  HENT:     Head: Normocephalic and atraumatic.  Eyes:     Conjunctiva/sclera: Conjunctivae normal.     Pupils: Pupils are equal, round, and reactive to light.  Cardiovascular:     Rate and Rhythm: Normal rate and regular rhythm.     Heart sounds: Normal heart sounds. No murmur heard.   Pulmonary:     Effort: Pulmonary effort is normal.     Breath sounds: Normal breath sounds.  Skin:    General: Skin is warm and dry.  Neurological:     General: No focal deficit present.     Mental Status: She is alert and oriented to person, place, and time. Mental status is at baseline.     Gait: Gait normal.  Psychiatric:        Attention and Perception: Attention and perception normal.        Mood and Affect: Mood and affect normal.        Speech: Speech normal.        Behavior: Behavior normal. Behavior is cooperative.        Thought Content: Thought content normal.        Cognition and Memory: Cognition and memory normal.        Judgment: Judgment normal.     Assessment  Plan  Annual physical exam Flu shot utd 09/2020 moderna 2/2 consider booster  Consider pna 23 vaccine  Tdap in 2016 per ptconsider in 2026 Consider pna 23 vaccine in the future Consider mmrgiven MMR Rx today  Hep b immune  [facility] 06/22/20 mri negative h/o breast cancer   Pap5/6/16 negative pap s/p hysterectomyq5 years had [facility] h/o abnormal pap h/o LEEP 1996  -advised pt call due f/u 04/2020   Colonoscopy age [age] Dr. Alice Reichert referred today   had  09/02/20 need to get record   Derm Dr. Phillip Heal saw 12/2018 due to see again 06/2019 had bx left lower legatypical mole and ln2 per pt -call to schedule appt as of 10/2020   Dr. Dione Housekeeper eye doctor saw 10/2020or 01/2019 need to get records [phone] Dr. Rodman Key dentist rec healthy diet and exercise   Obesity (BMI 30-39.9) - Plan: phentermine (ADIPEX-P) 37.5 MG tablet, phentermine (ADIPEX-P) 37.5 MG tablet 4 month supply  rec healthy diet and exercise   Hypertension associated with diabetes ([place]) - Plan: Comprehensive metabolic panel, Lipid panel, CBC with Differential/Platelet, Hemoglobin A1c, Urinalysis, Routine w reflex microscopic, Microalbumin / creatinine urine ratio On lis 10 mg qd monitor BP  Weight gain - Plan: FSH  Herpes - Plan: valACYclovir (VALTREX) 1000 MG tablet  Type 2 diabetes mellitus without complication, without long-term current use of insulin (HCC) - Plan: rosuvastatin (CRESTOR) 5 MG tablet, metFORMIN (GLUCOPHAGE) 500 MG tablet  Depression, unspecified depression type - Plan: citalopram (CELEXA) 40 MG tablet, busPIRone (BUSPAR) 5 MG tablet Anxiety - Plan: busPIRone (BUSPAR) 5 MG tablet      Provider: Dr. Olivia Mackie McLean-Scocuzza-Internal Medicine

## 2020-11-15 ENCOUNTER — Encounter: Payer: Self-pay | Admitting: Internal Medicine

## 2020-11-29 DIAGNOSIS — I152 Hypertension secondary to endocrine disorders: Secondary | ICD-10-CM | POA: Insufficient documentation

## 2020-11-29 DIAGNOSIS — E669 Obesity, unspecified: Secondary | ICD-10-CM | POA: Insufficient documentation

## 2020-11-29 MED ORDER — METFORMIN HCL 500 MG PO TABS: 500.0000 mg | ORAL_TABLET | Freq: Two times a day (BID) | ORAL | 3 refills | Status: DC

## 2020-11-29 MED ORDER — ROSUVASTATIN CALCIUM 5 MG PO TABS: 5.0000 mg | ORAL_TABLET | Freq: Every day | ORAL | 3 refills | Status: DC

## 2020-11-29 MED ORDER — LISINOPRIL 10 MG PO TABS: 10.0000 mg | ORAL_TABLET | Freq: Every day | ORAL | 3 refills | Status: DC

## 2020-11-29 MED ORDER — OMEPRAZOLE 20 MG PO CPDR: 20.0000 mg | DELAYED_RELEASE_CAPSULE | Freq: Every day | ORAL | 3 refills | Status: DC

## 2020-11-29 MED ORDER — CITALOPRAM HYDROBROMIDE 40 MG PO TABS: 40.0000 mg | ORAL_TABLET | Freq: Every day | ORAL | 3 refills | Status: DC

## 2020-11-29 MED ORDER — VALACYCLOVIR HCL 1 G PO TABS: 1000.0000 mg | ORAL_TABLET | Freq: Two times a day (BID) | ORAL | 3 refills | Status: DC

## 2020-11-29 MED ORDER — BUSPIRONE HCL 5 MG PO TABS: 5.0000 mg | ORAL_TABLET | Freq: Two times a day (BID) | ORAL | 3 refills | Status: DC

## 2021-01-01 DIAGNOSIS — Z03818 Encounter for observation for suspected exposure to other biological agents ruled out: Secondary | ICD-10-CM | POA: Diagnosis not present

## 2021-01-01 DIAGNOSIS — U071 COVID-19: Secondary | ICD-10-CM | POA: Diagnosis not present

## 2021-04-04 ENCOUNTER — Other Ambulatory Visit: Payer: Self-pay

## 2021-04-04 ENCOUNTER — Encounter: Payer: Self-pay | Admitting: Internal Medicine

## 2021-04-04 ENCOUNTER — Ambulatory Visit (INDEPENDENT_AMBULATORY_CARE_PROVIDER_SITE_OTHER): Payer: BC Managed Care – PPO | Admitting: Internal Medicine

## 2021-04-04 VITALS — BP 150/100 | HR 85 | Temp 97.9°F | Ht 62.0 in | Wt 166.0 lb

## 2021-04-04 DIAGNOSIS — Z1329 Encounter for screening for other suspected endocrine disorder: Secondary | ICD-10-CM

## 2021-04-04 DIAGNOSIS — I10 Essential (primary) hypertension: Secondary | ICD-10-CM | POA: Diagnosis not present

## 2021-04-04 DIAGNOSIS — R200 Anesthesia of skin: Secondary | ICD-10-CM

## 2021-04-04 DIAGNOSIS — E1159 Type 2 diabetes mellitus with other circulatory complications: Secondary | ICD-10-CM | POA: Diagnosis not present

## 2021-04-04 DIAGNOSIS — E538 Deficiency of other specified B group vitamins: Secondary | ICD-10-CM | POA: Diagnosis not present

## 2021-04-04 DIAGNOSIS — I152 Hypertension secondary to endocrine disorders: Secondary | ICD-10-CM

## 2021-04-04 DIAGNOSIS — R7303 Prediabetes: Secondary | ICD-10-CM

## 2021-04-04 DIAGNOSIS — E669 Obesity, unspecified: Secondary | ICD-10-CM

## 2021-04-04 MED ORDER — LISINOPRIL 10 MG PO TABS: 10.0000 mg | ORAL_TABLET | Freq: Two times a day (BID) | ORAL | 3 refills | Status: DC

## 2021-04-04 MED ORDER — PHENTERMINE HCL 37.5 MG PO TABS: 37.5000 mg | ORAL_TABLET | Freq: Every day | ORAL | 0 refills | Status: DC

## 2021-04-04 NOTE — Patient Instructions (Addendum)
Monitor blood pressure and let me know if not <130/<80  My chart your blood pressure in 2 weeks    Pneumococcal Polysaccharide Vaccine (PPSV23): What You Need to Know 1. Why get vaccinated? Pneumococcal polysaccharide vaccine (PPSV23) can prevent pneumococcal disease. Pneumococcal disease refers to any illness caused by pneumococcal bacteria. These bacteria can cause many types of illnesses, including pneumonia, which is an infection of the lungs. Pneumococcal bacteria are one of the most common causes of pneumonia. Besides pneumonia, pneumococcal bacteria can also cause:  Ear infections  Sinus infections  Meningitis (infection of the tissue covering the brain and spinal cord)  Bacteremia (bloodstream infection) Anyone can get pneumococcal disease, but children under 60 years of age, people with certain medical conditions, adults 33 years or older, and cigarette smokers are at the highest risk. Most pneumococcal infections are mild. However, some can result in long-term problems, such as brain damage or hearing loss. Meningitis, bacteremia, and pneumonia caused by pneumococcal disease can be fatal. 2. PPSV23 PPSV23 protects against 23 types of bacteria that cause pneumococcal disease. PPSV23 is recommended for:  All adults 10 years or older,  Anyone 2 years or older with certain medical conditions that can lead to an increased risk for pneumococcal disease. Most people need only one dose of PPSV23. A second dose of PPSV23, and another type of pneumococcal vaccine called PCV13, are recommended for certain high-risk groups. Your health care provider can give you more information. People 65 years or older should get a dose of PPSV23 even if they have already gotten one or more doses of the vaccine before they turned 73. 3. Talk with your health care provider Tell your vaccine provider if the person getting the vaccine:  Has had an allergic reaction after a previous dose of PPSV23, or has  any severe, life-threatening allergies. In some cases, your health care provider may decide to postpone PPSV23 vaccination to a future visit. People with minor illnesses, such as a cold, may be vaccinated. People who are moderately or severely ill should usually wait until they recover before getting PPSV23. Your health care provider can give you more information. 4. Risks of a vaccine reaction  Redness or pain where the shot is given, feeling tired, fever, or muscle aches can happen after PPSV23. People sometimes faint after medical procedures, including vaccination. Tell your provider if you feel dizzy or have vision changes or ringing in the ears. As with any medicine, there is a very remote chance of a vaccine causing a severe allergic reaction, other serious injury, or death. 5. What if there is a serious problem? An allergic reaction could occur after the vaccinated person leaves the clinic. If you see signs of a severe allergic reaction (hives, swelling of the face and throat, difficulty breathing, a fast heartbeat, dizziness, or weakness), call 9-1-1 and get the person to the nearest hospital. For other signs that concern you, call your health care provider. Adverse reactions should be reported to the Vaccine Adverse Event Reporting System (VAERS). Your health care provider will usually file this report, or you can do it yourself. Visit the VAERS website at www.vaers.SamedayNews.es or call (340)750-4148. VAERS is only for reporting reactions, and VAERS staff do not give medical advice. 6. How can I learn more?  Ask your health care provider.  Call your local or state health department.  Contact the Centers for Disease Control and Prevention (CDC): ? Call (626)396-7489 (1-800-CDC-INFO) or ? Visit CDC's website at http://hunter.com/ Vaccine Information Statement PPSV23  Vaccine (10/29/2018) This information is not intended to replace advice given to you by your health care provider. Make  sure you discuss any questions you have with your health care provider. Document Revised: 08/19/2020 Document Reviewed: 08/19/2020 Elsevier Patient Education  [place].

## 2021-04-04 NOTE — Progress Notes (Signed)
Chief Complaint  Patient presents with  . Follow-up   F/u  1. BP elevated today on lis 10 mg qd she is on adipex 37.5 for weight loss and wt from 177 to 166 BP at home per pt 138-142/80s  Will get fasting labs [facility] with work given copy today cmet, cbc, lipid, tsh, UA/protein/B12, A1C 2. Obesity wt trending down but not due to start adipex until 06/2021 given 4 month supply today  H/o prediabetes/DM2 on metformin 500 mg , crestor 5 mg qhs  3. C/o dental extraction 01/2021 [facility] Dr. Lewanda Rife [phone] and since having numbness below right chin which as a larger area and now less but not able to open her mouth  4. H/o breast cancer s/p nipple sparing mastectomies MRI 06/22/20 benign lymph nodes negative recurrence will f/u H/o outside [facility] 09/29/21 will be 1 year since last f/u after MRI breasts   Review of Systems  Constitutional: Positive for weight loss.  HENT: Negative for hearing loss.   Eyes: Negative for blurred vision.  Respiratory: Negative for shortness of breath.   Cardiovascular: Negative for chest pain.  Gastrointestinal: Negative for abdominal pain.  Musculoskeletal: Negative for falls and joint pain.  Skin: Negative for rash.  Neurological: Positive for sensory change.  Psychiatric/Behavioral: Negative for memory loss.   Past Medical History:  Diagnosis Date  . Allergy   . Basal cell carcinoma 06/12/2017   Central chest lat edge of scar   . Cancer [facility])    breast cancer dcis s/p double mastectomy   . Chicken pox   . COVID-19    11/2020 like allergies no smell/taste as of 04/04/21 back   . Depression   . Diabetes mellitus without complication (HCC)    A5W 7.7   . Dysplastic nevus 01/30/2008   R mid back - moderate, excision 02/26/2008  . Endometriosis   . GERD (gastroesophageal reflux disease)   . Hx of migraines   . Hyperlipidemia   . Hypertension    Past Surgical History:  Procedure Laterality Date  . ABDOMINAL HYSTERECTOMY    . Bilateral nipple  sparing mastectomies     Dr Charlane Ferretti  . BREAST SURGERY     breast bx DCIS s/p double mastectomy and breast reconstruction   . [place], 2006, 2008; 2 miscarriages  . CHOLECYSTECTOMY    . LEEP     1996   Family History  Problem Relation Age of Onset  . Asthma Mother   . COPD Mother   . Depression Mother   . Diabetes Mother   . Early death Father   . Hypertension Sister   . Alcohol abuse Maternal Grandmother   . Cancer Maternal Grandmother        breast  . Alcohol abuse Maternal Grandfather   . Cancer Maternal Grandfather        lung  . Cancer Paternal Grandmother        brain   . Heart disease Paternal Grandfather   . COPD Sister   . Depression Sister    Social History   Socioeconomic History  . Marital status: Married    Spouse name: Not on file  . Number of children: Not on file  . Years of education: Not on file  . Highest education level: Not on file  Occupational History  . Not on file  Tobacco Use  . Smoking status: Former Research scientist (life sciences)  . Smokeless tobacco: Never Used  . Tobacco comment: former x  7 years max 1 ppd   Substance and Sexual Activity  . Alcohol use: Yes  . Drug use: Not Currently  . Sexual activity: Yes    Comment: men  Other Topics Concern  . 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 busPIRone (BUSPAR) 5 MG tablet Take 1 tablet (5 mg total) by mouth 2 (two) times daily.  . citalopram (CELEXA) 40 MG tablet Take 1 tablet (40 mg total) by mouth daily.  . metFORMIN (GLUCOPHAGE) 500 MG tablet Take 1  tablet (500 mg total) by mouth 2 (two) times daily with a meal.  . omeprazole (PRILOSEC) 20 MG capsule Take 1 capsule (20 mg total) by mouth daily. 30 min. Before food  . rosuvastatin (CRESTOR) 5 MG tablet Take 1 tablet (5 mg total) by mouth at bedtime.  . valACYclovir (VALTREX) 1000 MG tablet Take 1 tablet (1,000 mg total) by mouth 2 (two) times daily. X 3-7 days with outbreak as needed  . [DISCONTINUED] lisinopril (ZESTRIL) 10 MG tablet Take 1 tablet (10 mg total) by mouth daily.  . [DISCONTINUED] phentermine (ADIPEX-P) 37.5 MG tablet Take 1 tablet (37.5 mg total) by mouth daily before breakfast. 1/2  . [DISCONTINUED] phentermine (ADIPEX-P) 37.5 MG tablet Take 1 tablet (37.5 mg total) by mouth daily before breakfast. 2/2   No Known Allergies No results found for this or any previous visit (from the past 2160 hour(s)). Objective  Body mass index is 30.36 kg/m. Wt Readings from Last 3 Encounters:  04/04/21 166 lb (75.3 kg)  11/03/20 174 lb 12.8 oz (79.3 kg)  03/09/20 177 lb 6.4 oz (80.5 kg)   Temp Readings from Last 3 Encounters:  04/04/21 97.9 F (36.6 C) (Oral)  11/03/20 98.5 F (36.9 C) (Oral)  03/09/20 97.8 F (36.6 C) (Temporal)   BP Readings from Last 3 Encounters:  04/04/21 (!) 150/100  11/03/20 116/86  03/09/20 124/80   Pulse Readings from Last 3 Encounters:  04/04/21 85  11/03/20 99  03/09/20 78    Physical Exam Vitals and nursing note reviewed.  Constitutional:      Appearance: Normal appearance. She is well-developed and well-groomed. She is obese.  HENT:     Head: Normocephalic and atraumatic.  Cardiovascular:     Rate and Rhythm: Normal rate and regular rhythm.     Heart sounds: Normal heart sounds. No murmur heard.   Pulmonary:     Effort: Pulmonary effort is normal.     Breath sounds: Normal breath sounds.  Skin:    General: Skin is warm and dry.  Neurological:     General: No focal deficit present.     Mental Status: She is alert and oriented to  person, place, and time. Mental status is at baseline.     Gait: Gait normal.  Psychiatric:        Attention and Perception: Attention and perception normal.        Mood and Affect: Mood and affect normal.  Speech: Speech normal.        Behavior: Behavior normal. Behavior is cooperative.        Thought Content: Thought content normal.        Cognition and Memory: Cognition and memory normal.        Judgment: Judgment normal.     Assessment  Plan   Hypertension associated with diabetes ([place])  Essential hypertension - Plan: lisinopril (ZESTRIL) 10 MG tablet qd increase to bid, Comprehensive metabolic panel, Lipid panel, CBC with Differential/Platelet, Urinalysis, Routine w reflex microscopic, Microalbumin / creatinine urine ratio [facility] On crestor 5 mg qhs  Metformin 500 mg bid  Monitor BP  B12 deficiency - Plan: B12  Prediabetes - Plan: Hemoglobin A1c  Right facial numbness s/p dental extraction 01/2021  Dr. Lewanda Rife  Advised pt to call oral surgery back due to this right chin numb below chin was larger area now less but still present  Will CC note to dental   Obesity (BMI 30-39.9) - Plan: phentermine (ADIPEX-P) 37.5 MG tablet, phentermine (ADIPEX-P) 37.5 MG tablet 4 months on and 3 months off fill next Rx 06/2021  rec healthy diet and exercise    HM Flu shot utd 09/2020 moderna 2/2 consider booster not considering as of 04/04/21  -had covid 11/2020+ Consider pna 23 vaccine disc today to let me know if wanted here of pharmacy Tdap in 2016 per ptconsider in 2026 Consider pna 23 vaccine in the future Consider mmrgiven MMR Rx previously not had Hep b immune  [facility] 06/22/20 mri negative except benign left lymph node -->h/o breast cancer pt will f/u 08/2021 [facility]  Pap5/6/16 negative pap s/p hysterectomyq5 years had [facility] h/o abnormal pap h/o LEEP 1996 -advised pt call due f/u  04/2020  Colonoscopy [id number] Dr. Kassie Mends 1 [facility], diverticulosis 09/02/20   Derm Dr. Phillip Heal saw 12/2018 due to see again 06/2019 had bx left lower legatypical mole and ln2 per pt -call to schedule f/us per derm  Dr. Dione Housekeeper eye doctor saw 10/2020or 01/2019 need to get records [phone] 08/17/20 negative   Dr. Rodman Key dentist, dental extraction 01/2021 Dr. Lewanda Rife oral surgery [place]   rec healthy diet and exercise   Obesity (BMI 30-39.9) - Plan: phentermine (ADIPEX-P) 37.5 MG tablet, phentermine (ADIPEX-P) 37.5 MG tablet 4 month supply  rec healthy diet and exercise    Provider: Dr. Olivia Mackie McLean-Scocuzza-Internal Medicine

## 2021-04-06 ENCOUNTER — Encounter: Payer: Self-pay | Admitting: Internal Medicine

## 2021-04-10 DIAGNOSIS — R200 Anesthesia of skin: Secondary | ICD-10-CM | POA: Insufficient documentation

## 2021-04-10 DIAGNOSIS — R7303 Prediabetes: Secondary | ICD-10-CM | POA: Insufficient documentation

## 2021-04-12 DIAGNOSIS — Z0189 Encounter for other specified special examinations: Secondary | ICD-10-CM | POA: Diagnosis not present

## 2021-04-18 ENCOUNTER — Telehealth: Payer: Self-pay | Admitting: Internal Medicine

## 2021-04-18 NOTE — Telephone Encounter (Signed)
[facility] labs 04/13/21  Blood cts normal  Liver kidneys normal  Urine normal  Cholesterol normal  A1C 6.3 at goal  Thyroid lab normal  Vitamin B12 normal

## 2021-04-19 NOTE — Telephone Encounter (Signed)
Left message for patient to return call back. Also informed patient that I would send a mychart of her results on VM.

## 2021-07-06 ENCOUNTER — Other Ambulatory Visit: Payer: Self-pay

## 2021-07-06 ENCOUNTER — Ambulatory Visit: Payer: BC Managed Care – PPO | Admitting: Dermatology

## 2021-07-06 DIAGNOSIS — D229 Melanocytic nevi, unspecified: Secondary | ICD-10-CM

## 2021-07-06 DIAGNOSIS — C4491 Basal cell carcinoma of skin, unspecified: Secondary | ICD-10-CM

## 2021-07-06 DIAGNOSIS — C44712 Basal cell carcinoma of skin of right lower limb, including hip: Secondary | ICD-10-CM | POA: Diagnosis not present

## 2021-07-06 DIAGNOSIS — Z1283 Encounter for screening for malignant neoplasm of skin: Secondary | ICD-10-CM

## 2021-07-06 DIAGNOSIS — L821 Other seborrheic keratosis: Secondary | ICD-10-CM

## 2021-07-06 DIAGNOSIS — L578 Other skin changes due to chronic exposure to nonionizing radiation: Secondary | ICD-10-CM | POA: Diagnosis not present

## 2021-07-06 DIAGNOSIS — L814 Other melanin hyperpigmentation: Secondary | ICD-10-CM

## 2021-07-06 DIAGNOSIS — Z86018 Personal history of other benign neoplasm: Secondary | ICD-10-CM

## 2021-07-06 DIAGNOSIS — D489 Neoplasm of uncertain behavior, unspecified: Secondary | ICD-10-CM

## 2021-07-06 DIAGNOSIS — D1800 Hemangioma unspecified site: Secondary | ICD-10-CM

## 2021-07-06 DIAGNOSIS — Z85828 Personal history of other malignant neoplasm of skin: Secondary | ICD-10-CM

## 2021-07-06 HISTORY — DX: Basal cell carcinoma of skin, unspecified: C44.91

## 2021-07-06 MED ORDER — MUPIROCIN 2 % EX OINT: 1.0000 "application " | TOPICAL_OINTMENT | Freq: Every day | CUTANEOUS | 0 refills | Status: DC

## 2021-07-06 NOTE — Patient Instructions (Signed)
Biopsy Wound Care Instructions  Leave the original bandage on for 24 hours if possible.  If the bandage becomes soaked or soiled before that time, it is OK to remove it and examine the wound.  A small amount of post-operative bleeding is normal.  If excessive bleeding occurs, remove the bandage, place gauze over the site and apply continuous pressure (no peeking) over the area for 30 minutes. If this does not work, please call our clinic as soon as possible or page your doctor if it is after hours.   Once a day, cleanse the wound with soap and water. It is fine to shower. If a thick crust develops you may use a Q-tip dipped into dilute hydrogen peroxide (mix 1:1 with water) to dissolve it.  Hydrogen peroxide can slow the healing process, so use it only as needed.    After washing, apply petroleum jelly (Vaseline) or an antibiotic ointment if your doctor prescribed one for you, followed by a bandage.    For best healing, the wound should be covered with a layer of ointment at all times. If you are not able to keep the area covered with a bandage to hold the ointment in place, this may mean re-applying the ointment several times a day.  Continue this wound care until the wound has healed and is no longer open.   Itching and mild discomfort is normal during the healing process. However, if you develop pain or severe itching, please call our office.   If you have any discomfort, you can take Tylenol (acetaminophen) or ibuprofen as directed on the bottle. (Please do not take these if you have an allergy to them or cannot take them for another reason).  Some redness, tenderness and white or yellow material in the wound is normal healing.  If the area becomes very sore and red, or develops a thick yellow-green material (pus), it may be infected; please notify us.    If you have stitches, return to clinic as directed to have the stitches removed. You will continue wound care for 2-3 days after the stitches  are removed.   Wound healing continues for up to one year following surgery. It is not unusual to experience pain in the scar from time to time during the interval.  If the pain becomes severe or the scar thickens, you should notify the office.    A slight amount of redness in a scar is expected for the first six months.  After six months, the redness will fade and the scar will soften and fade.  The color difference becomes less noticeable with time.  If there are any problems, return for a post-op surgery check at your earliest convenience.  To improve the appearance of the scar, you can use silicone scar gel, cream, or sheets (such as Mederma or Serica) every night for up to one year. These are available over the counter (without a prescription).  Please call our office at [phone] for any questions or concerns.   Melanoma ABCDEs  Melanoma is the most dangerous type of skin cancer, and is the leading cause of death from skin disease.  You are more likely to develop melanoma if you: Have light-colored skin, light-colored eyes, or red or blond hair Spend a lot of time in the sun Tan regularly, either outdoors or in a tanning bed Have had blistering sunburns, especially during childhood Have a close family member who has had a melanoma Have atypical moles or large birthmarks  Early detection of melanoma is key since treatment is typically straightforward and cure rates are extremely high if we catch it early.   The first sign of melanoma is often a change in a mole or a new dark spot.  The ABCDE system is a way of remembering the signs of melanoma.  A for asymmetry:  The two halves do not match. B for border:  The edges of the growth are irregular. C for color:  A mixture of colors are present instead of an even brown color. D for diameter:  Melanomas are usually (but not always) greater than 93mm - the size of a pencil eraser. E for evolution:  The spot keeps changing in size,  shape, and color.  Please check your skin once per month between visits. You can use a small mirror in front and a large mirror behind you to keep an eye on the back side or your body.   If you see any new or changing lesions before your next follow-up, please call to schedule a visit.  Please continue daily skin protection including broad spectrum sunscreen SPF 30+ to sun-exposed areas, reapplying every 2 hours as needed when you're outdoors.    Recommend taking Heliocare sun protection supplement daily in sunny weather for additional sun protection. For maximum protection on the sunniest days, you can take up to 2 capsules of regular Heliocare OR take 1 capsule of Heliocare Ultra. For prolonged exposure (such as a full day in the sun), you can repeat your dose of the supplement 4 hours after your first dose. Heliocare can be purchased at [facility] or at [email or web address].     If you have any questions or concerns for your doctor, please call our main line at [phone] and press option 4 to reach your doctor's medical assistant. If no one answers, please leave a voicemail as directed and we will return your call as soon as possible. Messages left after 4 pm will be answered the following business day.   You may also send [place] a message via [place]. We typically respond to MyChart messages within 1-2 business days.  For prescription refills, please ask your pharmacy to contact our office. Our fax number is [phone].  If you have an urgent issue when the clinic is closed that cannot wait until the next business day, you can page your doctor at the number below.    Please note that while we do our best to be available for urgent issues outside of office hours, we are not available 24/7.   If you have an urgent issue and are unable to reach [place], you may choose to seek medical care at your doctor's office, retail clinic, urgent care center, or emergency room.  If you have a medical  emergency, please immediately call 911 or go to the emergency department.  Pager Numbers  - Dr. Nehemiah Massed: [phone]  - Dr. Laurence Ferrari: [phone]  - Dr. Nicole Kindred: [phone]  In the event of inclement weather, please call our main line at [phone] for an update on the status of any delays or closures.  Dermatology Medication Tips: Please keep the boxes that topical medications come in in order to help keep track of the instructions about where and how to use these. Pharmacies typically print the medication instructions only on the boxes and not directly on the medication tubes.   If your medication is too expensive, please contact our office at [phone] option 4 or send [place] a message through  MyChart.   We are unable to tell what your co-pay for medications will be in advance as this is different depending on your insurance coverage. However, we may be able to find a substitute medication at lower cost or fill out paperwork to get insurance to cover a needed medication.   If a prior authorization is required to get your medication covered by your insurance company, please allow [place] 1-2 business days to complete this process.  Drug prices often vary depending on where the prescription is filled and some pharmacies may offer cheaper prices.  The website www.goodrx.com contains coupons for medications through different pharmacies. The prices here do not account for what the cost may be with help from insurance (it may be cheaper with your insurance), but the website can give you the price if you did not use any insurance.  - You can print the associated coupon and take it with your prescription to the pharmacy.  - You may also stop by our office during regular business hours and pick up a GoodRx coupon card.  - If you need your prescription sent electronically to a different pharmacy, notify our office through [facility] or by phone at [phone] option 4.

## 2021-07-06 NOTE — Progress Notes (Signed)
A  Follow-Up Visit   Subjective  Sandra Griffin is a 50 y.o. female who presents for the following: Follow-up (Patient here concerning a spot on right leg. She reports spot has been there for about a year or more. Patient reports it is sometimes sore and itchy. Patient is also here for full body exam. Patient has history of bcc in 2018 at chest. She also reports family history of skin cancer in sister. ).  Patient here for full body skin exam and skin cancer screening.   The following portions of the chart were reviewed this encounter and updated as appropriate:  Tobacco  Allergies  Meds  Problems  Med Hx  Surg Hx  Fam Hx      Objective  Well appearing patient in no apparent distress; mood and affect are within normal limits.  A full examination was performed including scalp, head, eyes, ears, nose, lips, neck, chest, axillae, abdomen, back, buttocks, bilateral upper extremities, bilateral lower extremities, hands, feet, fingers, toes, fingernails, and toenails. All findings within normal limits unless otherwise noted below.  Right medial pretibial 1.0 cm eroded thin pink plaque      Assessment & Plan  Neoplasm of uncertain behavior Right medial pretibial  Skin / nail biopsy Type of biopsy: tangential   Informed consent: discussed and consent obtained   Timeout: patient name, date of birth, surgical site, and procedure verified   Patient was prepped and draped in usual sterile fashion: Area prepped with isopropyl alcohol. Anesthesia: the lesion was anesthetized in a standard fashion   Anesthetic:  1% lidocaine w/ epinephrine 1-100,000 buffered w/ 8.4% NaHCO3 Instrument used: flexible razor blade   Hemostasis achieved with: aluminum chloride   Outcome: patient tolerated procedure well   Post-procedure details: wound care instructions given   Additional details:  Mupirocin and a bandage applied  mupirocin ointment (BACTROBAN) 2 % Apply 1 application topically  daily. To affected area cover with bandaid until healed.  Specimen 1 - Surgical pathology Differential Diagnosis: R/o bcc vs other  Check Margins: No  1.0 cm eroded thin pink plaque   R/o bcc vs other  Lentigines - Scattered tan macules - Due to sun exposure - Benign-appering, observe - Recommend daily broad spectrum sunscreen SPF 30+ to sun-exposed areas, reapply every 2 hours as needed. - Call for any changes  Seborrheic Keratoses - Stuck-on, waxy, tan-brown papules and/or plaques on left forearm  - Benign-appearing - Discussed benign etiology and prognosis. - Observe - Call for any changes  Melanocytic Nevi - Tan-brown and/or pink-flesh-colored symmetric macules and papules - Benign appearing on exam today - Observation - Call clinic for new or changing moles - Recommend daily use of broad spectrum spf 30+ sunscreen to sun-exposed areas.   Hemangiomas - Red papules - Discussed benign nature - Observe - Call for any changes  Actinic Damage - Chronic condition, secondary to cumulative UV/sun exposure - diffuse scaly erythematous macules with underlying dyspigmentation - Recommend daily broad spectrum sunscreen SPF 30+ to sun-exposed areas, reapply every 2 hours as needed.  - Staying in the shade or wearing long sleeves, sun glasses (UVA+UVB protection) and wide brim hats (4-inch brim around the entire circumference of the hat) are also recommended for sun protection.  - Call for new or changing lesions.  History of Basal Cell Carcinoma of the Skin - No evidence of recurrence today central chest lateral edge of scar (2018) - Recommend regular full body skin exams - Recommend daily broad spectrum sunscreen SPF  30+ to sun-exposed areas, reapply every 2 hours as needed.  - Call if any new or changing lesions are noted between office visits  History of Dysplastic Nevi - No evidence of recurrence today right mid back exci (2009) - Recommend regular full body skin  exams - Recommend daily broad spectrum sunscreen SPF 30+ to sun-exposed areas, reapply every 2 hours as needed.  - Call if any new or changing lesions are noted between office visits  Skin cancer screening performed today.  Return in about 1 year (around 07/06/2022) for tbse. I, Ruthell Rummage, CMA, am acting as scribe for Forest Gleason, MD.   Documentation: I have reviewed the above documentation for accuracy and completeness, and I agree with the above.  Forest Gleason, MD

## 2021-07-12 ENCOUNTER — Encounter: Payer: Self-pay | Admitting: Dermatology

## 2021-07-18 ENCOUNTER — Telehealth: Payer: Self-pay

## 2021-07-18 NOTE — Telephone Encounter (Signed)
-----   Message from Alfonso Patten, MD sent at 07/16/2021 10:01 PM EDT ----- Skin , right medial pretibial BASAL CELL CARCINOMA, SUPERFICIAL AND NODULAR PATTERNS, BASE INVOLVED --> Most common type of skin cancer, not life threatening but grows deeper and wider where it is.  Treatment options include ED&C (cure rate around 85% of the time, no down time, spot heals in on its own with good wound care, no activity restrictions after) vs Mohs surgery (higher cure rate around 98-99% of the time, may be able to close the spot with a line scar or may have to leave it open to heal on it's own over time). If patient unsure, I am happy to speak with her to discuss when I am back in town.  MAs please call. Thank you!

## 2021-07-19 ENCOUNTER — Other Ambulatory Visit: Payer: Self-pay

## 2021-07-19 ENCOUNTER — Telehealth: Payer: Self-pay

## 2021-07-19 DIAGNOSIS — C44712 Basal cell carcinoma of skin of right lower limb, including hip: Secondary | ICD-10-CM

## 2021-07-19 NOTE — Telephone Encounter (Signed)
-----   Message from Alfonso Patten, MD sent at 07/16/2021 10:01 PM EDT ----- Skin , right medial pretibial BASAL CELL CARCINOMA, SUPERFICIAL AND NODULAR PATTERNS, BASE INVOLVED --> Most common type of skin cancer, not life threatening but grows deeper and wider where it is.  Treatment options include ED&C (cure rate around 85% of the time, no down time, spot heals in on its own with good wound care, no activity restrictions after) vs Mohs surgery (higher cure rate around 98-99% of the time, may be able to close the spot with a line scar or may have to leave it open to heal on it's own over time). If patient unsure, I am happy to speak with her to discuss when I am back in town.  MAs please call. Thank you!

## 2021-07-19 NOTE — Telephone Encounter (Signed)
Patient advised of biopsy results. She is going to research what [facility] provider is in network with her insurance and let me know where to send referral. Patient has no additional questions at this time.

## 2021-07-19 NOTE — Progress Notes (Signed)
Patient would like to go to Dr. Lacinda Axon who is in network with insurance. Referral placed.

## 2021-07-27 ENCOUNTER — Telehealth: Payer: Self-pay | Admitting: Dermatology

## 2021-07-27 NOTE — Telephone Encounter (Signed)
Please schedule pt for 6 month FBSE given recent skin cancer. Thank you!

## 2021-09-11 DIAGNOSIS — D0511 Intraductal carcinoma in situ of right breast: Secondary | ICD-10-CM | POA: Diagnosis not present

## 2021-09-22 ENCOUNTER — Other Ambulatory Visit: Payer: Self-pay | Admitting: Internal Medicine

## 2021-09-23 NOTE — Telephone Encounter (Signed)
This patient needs an appointment with her PCP to discuss this medication and to follow-up on her BP prior to this being refilled.

## 2021-10-03 DIAGNOSIS — Z23 Encounter for immunization: Secondary | ICD-10-CM | POA: Diagnosis not present

## 2021-10-03 DIAGNOSIS — M779 Enthesopathy, unspecified: Secondary | ICD-10-CM | POA: Diagnosis not present

## 2021-10-11 DIAGNOSIS — M7711 Lateral epicondylitis, right elbow: Secondary | ICD-10-CM | POA: Diagnosis not present

## 2021-10-11 DIAGNOSIS — M25511 Pain in right shoulder: Secondary | ICD-10-CM | POA: Diagnosis not present

## 2021-11-07 ENCOUNTER — Ambulatory Visit: Payer: BC Managed Care – PPO | Admitting: Internal Medicine

## 2021-11-15 DIAGNOSIS — J019 Acute sinusitis, unspecified: Secondary | ICD-10-CM | POA: Diagnosis not present

## 2021-11-15 DIAGNOSIS — R059 Cough, unspecified: Secondary | ICD-10-CM | POA: Diagnosis not present

## 2021-12-13 ENCOUNTER — Other Ambulatory Visit: Payer: Self-pay | Admitting: Internal Medicine

## 2021-12-13 DIAGNOSIS — F32A Depression, unspecified: Secondary | ICD-10-CM

## 2021-12-13 DIAGNOSIS — E119 Type 2 diabetes mellitus without complications: Secondary | ICD-10-CM

## 2021-12-13 DIAGNOSIS — F419 Anxiety disorder, unspecified: Secondary | ICD-10-CM

## 2021-12-13 DIAGNOSIS — E785 Hyperlipidemia, unspecified: Secondary | ICD-10-CM

## 2021-12-20 DIAGNOSIS — C44712 Basal cell carcinoma of skin of right lower limb, including hip: Secondary | ICD-10-CM | POA: Diagnosis not present

## 2021-12-21 ENCOUNTER — Encounter: Payer: Self-pay | Admitting: Internal Medicine

## 2021-12-21 ENCOUNTER — Ambulatory Visit (INDEPENDENT_AMBULATORY_CARE_PROVIDER_SITE_OTHER): Payer: BC Managed Care – PPO | Admitting: Internal Medicine

## 2021-12-21 ENCOUNTER — Other Ambulatory Visit: Payer: Self-pay

## 2021-12-21 VITALS — BP 130/82 | HR 90 | Temp 97.2°F | Ht 62.0 in | Wt 186.0 lb

## 2021-12-21 DIAGNOSIS — Z0000 Encounter for general adult medical examination without abnormal findings: Secondary | ICD-10-CM | POA: Diagnosis not present

## 2021-12-21 DIAGNOSIS — E669 Obesity, unspecified: Secondary | ICD-10-CM

## 2021-12-21 DIAGNOSIS — E1159 Type 2 diabetes mellitus with other circulatory complications: Secondary | ICD-10-CM

## 2021-12-21 DIAGNOSIS — E559 Vitamin D deficiency, unspecified: Secondary | ICD-10-CM

## 2021-12-21 DIAGNOSIS — R110 Nausea: Secondary | ICD-10-CM | POA: Diagnosis not present

## 2021-12-21 DIAGNOSIS — Z23 Encounter for immunization: Secondary | ICD-10-CM

## 2021-12-21 DIAGNOSIS — I152 Hypertension secondary to endocrine disorders: Secondary | ICD-10-CM

## 2021-12-21 MED ORDER — TETANUS-DIPHTH-ACELL PERTUSSIS 5-2.5-18.5 LF-MCG/0.5 IM SUSP: 0.5000 mL | Freq: Once | INTRAMUSCULAR | 0 refills | Status: AC

## 2021-12-21 MED ORDER — ONDANSETRON 4 MG PO TBDP: 4.0000 mg | ORAL_TABLET | Freq: Two times a day (BID) | ORAL | 0 refills | Status: DC | PRN

## 2021-12-21 MED ORDER — OZEMPIC (0.25 OR 0.5 MG/DOSE) 2 MG/1.5ML ~~LOC~~ SOPN: 0.2500 mg | PEN_INJECTOR | SUBCUTANEOUS | 2 refills | Status: DC

## 2021-12-21 MED ORDER — PNEUMOCOCCAL 13-VAL CONJ VACC IM SUSP: 0.5000 mL | Freq: Once | INTRAMUSCULAR | 0 refills | Status: AC

## 2021-12-21 NOTE — Patient Instructions (Addendum)
Dr. Georgianne Fick   Phone Fax E-mail Address  [phone] Not available [place] [address]     Specialties     Obstetrics and Gynecology      Semaglutide Injection What is this medication? SEMAGLUTIDE (SEM a GLOO tide) treats type 2 diabetes. It works by increasing insulin levels in your body, which decreases your blood sugar (glucose). It also reduces the amount of sugar released into the blood and slows down your digestion. It can also be used to lower the risk of heart attack and stroke in people with type 2 diabetes. Changes to diet and exercise are often combined with this medication. This medicine may be used for other purposes; ask your health care provider or pharmacist if you have questions. COMMON BRAND NAME(S): OZEMPIC What should I tell my care team before I take this medication? They need to know if you have any of these conditions: Endocrine tumors (MEN 2) or if someone in your family had these tumors Eye disease, vision problems History of pancreatitis Kidney disease Stomach problems Thyroid cancer or if someone in your family had thyroid cancer An unusual or allergic reaction to semaglutide, other medications, foods, dyes, or preservatives Pregnant or trying to get pregnant Breast-feeding How should I use this medication? This medication is for injection under the skin of your upper leg (thigh), stomach area, or upper arm. It is given once every week (every 7 days). You will be taught how to prepare and give this medication. Use exactly as directed. Take your medication at regular intervals. Do not take it more often than directed. If you use this medication with insulin, you should inject this medication and the insulin separately. Do not mix them together. Do not give the injections right next to each other. Change (rotate) injection sites with each injection. It is important that you put your used needles and syringes in a special  sharps container. Do not put them in a trash can. If you do not have a sharps container, call your pharmacist or care team to get one. A special MedGuide will be given to you by the pharmacist with each prescription and refill. Be sure to read this information carefully each time. This medication comes with INSTRUCTIONS FOR USE. Ask your pharmacist for directions on how to use this medication. Read the information carefully. Talk to your pharmacist or care team if you have questions. Talk to your care team about the use of this medication in children. Special care may be needed. Overdosage: If you think you have taken too much of this medicine contact a poison control center or emergency room at once. NOTE: This medicine is only for you. Do not share this medicine with others. What if I miss a dose? If you miss a dose, take it as soon as you can within 5 days after the missed dose. Then take your next dose at your regular weekly time. If it has been longer than 5 days after the missed dose, do not take the missed dose. Take the next dose at your regular time. Do not take double or extra doses. If you have questions about a missed dose, contact your care team for advice. What may interact with this medication? Other medications for diabetes Many medications may cause changes in blood sugar, these include: Alcohol containing beverages Antiviral medications for HIV or AIDS Aspirin and aspirin-like medications Certain medications for blood pressure, heart disease, irregular heart beat Chromium Diuretics Female hormones, such  as estrogens or progestins, birth control pills Fenofibrate Gemfibrozil Isoniazid Lanreotide Female hormones or anabolic steroids MAOIs like Carbex, Eldepryl, Marplan, Nardil, and Parnate Medications for weight loss Medications for allergies, asthma, cold, or cough Medications for depression, anxiety, or psychotic disturbances Niacin Nicotine NSAIDs, medications for pain  and inflammation, like ibuprofen or naproxen Octreotide Pasireotide Pentamidine Phenytoin Probenecid Quinolone antibiotics such as ciprofloxacin, levofloxacin, ofloxacin Some herbal dietary supplements Steroid medications such as prednisone or cortisone Sulfamethoxazole; trimethoprim Thyroid hormones Some medications can hide the warning symptoms of low blood sugar (hypoglycemia). You may need to monitor your blood sugar more closely if you are taking one of these medications. These include: Beta-blockers, often used for high blood pressure or heart problems (examples include atenolol, metoprolol, propranolol) Clonidine Guanethidine Reserpine This list may not describe all possible interactions. Give your health care provider a list of all the medicines, herbs, non-prescription drugs, or dietary supplements you use. Also tell them if you smoke, drink alcohol, or use illegal drugs. Some items may interact with your medicine. What should I watch for while using this medication? Visit your care team for regular checks on your progress. Drink plenty of fluids while taking this medication. Check with your care team if you get an attack of severe diarrhea, nausea, and vomiting. The loss of too much body fluid can make it dangerous for you to take this medication. A test called the HbA1C (A1C) will be monitored. This is a simple blood test. It measures your blood sugar control over the last 2 to 3 months. You will receive this test every 3 to 6 months. Learn how to check your blood sugar. Learn the symptoms of low and high blood sugar and how to manage them. Always carry a quick-source of sugar with you in case you have symptoms of low blood sugar. Examples include hard sugar candy or glucose tablets. Make sure others know that you can choke if you eat or drink when you develop serious symptoms of low blood sugar, such as seizures or unconsciousness. They must get medical help at once. Tell your care  team if you have high blood sugar. You might need to change the dose of your medication. If you are sick or exercising more than usual, you might need to change the dose of your medication. Do not skip meals. Ask your care team if you should avoid alcohol. Many nonprescription cough and cold products contain sugar or alcohol. These can affect blood sugar. Pens should never be shared. Even if the needle is changed, sharing may result in passing of viruses like hepatitis or HIV. Wear a medical ID bracelet or chain, and carry a card that describes your disease and details of your medication and dosage times. Do not become pregnant while taking this medication. Women should inform their care team if they wish to become pregnant or think they might be pregnant. There is a potential for serious side effects to an unborn child. Talk to your care team for more information. What side effects may I notice from receiving this medication? Side effects that you should report to your care team as soon as possible: Allergic reactions--skin rash, itching, hives, swelling of the face, lips, tongue, or throat Change in vision Dehydration--increased thirst, dry mouth, feeling faint or lightheaded, headache, dark yellow or brown urine Gallbladder problems--severe stomach pain, nausea, vomiting, fever Heart palpitations--rapid, pounding, or irregular heartbeat Kidney injury--decrease in the amount of urine, swelling of the ankles, hands, or feet Pancreatitis--severe stomach pain that  spreads to your back or gets worse after eating or when touched, fever, nausea, vomiting Thyroid cancer--new mass or lump in the neck, pain or trouble swallowing, trouble breathing, hoarseness Side effects that usually do not require medical attention (report to your care team if they continue or are bothersome): Diarrhea Loss of appetite Nausea Stomach pain Vomiting This list may not describe all possible side effects. Call your doctor  for medical advice about side effects. You may report side effects to FDA at 1-800-FDA-1088. Where should I keep my medication? Keep out of the reach of children. Store unopened pens in a refrigerator between 2 and 8 degrees C (36 and 46 degrees F). Do not freeze. Protect from light and heat. After you first use the pen, it can be stored for 56 days at room temperature between 15 and 30 degrees C (59 and 86 degrees F) or in a refrigerator. Throw away your used pen after 56 days or after the expiration date, whichever comes first. Do not store your pen with the needle attached. If the needle is left on, medication may leak from the pen. NOTE: This sheet is a summary. It may not cover all possible information. If you have questions about this medicine, talk to your doctor, pharmacist, or health care provider.  2022 Elsevier/Gold Standard (2021-03-23 00:00:00)   Tdap (Tetanus, Diphtheria, Pertussis) Vaccine: What You Need to Know 1. Why get vaccinated? Tdap vaccine can prevent tetanus, diphtheria, and pertussis. Diphtheria and pertussis spread from person to person. Tetanus enters the body through cuts or wounds. TETANUS (T) causes painful stiffening of the muscles. Tetanus can lead to serious health problems, including being unable to open the mouth, having trouble swallowing and breathing, or death. DIPHTHERIA (D) can lead to difficulty breathing, heart failure, paralysis, or death. PERTUSSIS (aP), also known as "whooping cough," can cause uncontrollable, violent coughing that makes it hard to breathe, eat, or drink. Pertussis can be extremely serious especially in babies and young children, causing pneumonia, convulsions, brain damage, or death. In teens and adults, it can cause weight loss, loss of bladder control, passing out, and rib fractures from severe coughing. 2. Tdap vaccine Tdap is only for children 7 years and older, adolescents, and adults.  Adolescents should receive a single dose of  Tdap, preferably at age 4 or 4 years. Pregnant people should get a dose of Tdap during every pregnancy, preferably during the early part of the third trimester, to help protect the newborn from pertussis. Infants are most at risk for severe, life-threatening complications from pertussis. Adults who have never received Tdap should get a dose of Tdap. Also, adults should receive a booster dose of either Tdap or Td (a different vaccine that protects against tetanus and diphtheria but not pertussis) every 10 years, or after 5 years in the case of a severe or dirty wound or burn. Tdap may be given at the same time as other vaccines. 3. Talk with your health care provider Tell your vaccine provider if the person getting the vaccine: Has had an allergic reaction after a previous dose of any vaccine that protects against tetanus, diphtheria, or pertussis, or has any severe, life-threatening allergies Has had a coma, decreased level of consciousness, or prolonged seizures within 7 days after a previous dose of any pertussis vaccine (DTP, DTaP, or Tdap) Has seizures or another nervous system problem Has ever had Guillain-Barr Syndrome (also called "GBS") Has had severe pain or swelling after a previous dose of any vaccine that  protects against tetanus or diphtheria In some cases, your health care provider may decide to postpone Tdap vaccination until a future visit. People with minor illnesses, such as a cold, may be vaccinated. People who are moderately or severely ill should usually wait until they recover before getting Tdap vaccine.  Your health care provider can give you more information. 4. Risks of a vaccine reaction Pain, redness, or swelling where the shot was given, mild fever, headache, feeling tired, and nausea, vomiting, diarrhea, or stomachache sometimes happen after Tdap vaccination. People sometimes faint after medical procedures, including vaccination. Tell your provider if you feel dizzy  or have vision changes or ringing in the ears.  As with any medicine, there is a very remote chance of a vaccine causing a severe allergic reaction, other serious injury, or death. 5. What if there is a serious problem? An allergic reaction could occur after the vaccinated person leaves the clinic. If you see signs of a severe allergic reaction (hives, swelling of the face and throat, difficulty breathing, a fast heartbeat, dizziness, or weakness), call 9-1-1 and get the person to the nearest hospital. For other signs that concern you, call your health care provider.  Adverse reactions should be reported to the Vaccine Adverse Event Reporting System (VAERS). Your health care provider will usually file this report, or you can do it yourself. Visit the VAERS website at www.vaers.SamedayNews.es or call 780-015-8899. VAERS is only for reporting reactions, and VAERS staff members do not give medical advice. 6. The National Vaccine Injury Compensation Program The [employer] Vaccine Injury Compensation Program (VICP) is a federal program that was created to compensate people who may have been injured by certain vaccines. Claims regarding alleged injury or death due to vaccination have a time limit for filing, which may be as short as two years. Visit the VICP website at GoldCloset.com.ee or call 316-724-9887 to learn about the program and about filing a claim. 7. How can I learn more? Ask your health care provider. Call your local or state health department. Visit the website of the Food and Drug Administration (FDA) for vaccine package inserts and additional information at TraderRating.uy. Contact the Centers for Disease Control and Prevention (CDC): Call 661-052-1085 (1-800-CDC-INFO) or Visit CDC's website at http://hunter.com/. Vaccine Information Statement Tdap (Tetanus, Diphtheria, Pertussis) Vaccine (08/05/2020) This information is not intended to replace  advice given to you by your health care provider. Make sure you discuss any questions you have with your health care provider. Document Revised: 08/31/2020 Document Reviewed: 08/31/2020 Elsevier Patient Education  [place].  Pneumococcal Conjugate Vaccine (Prevnar 13) Suspension for Injection What is this medication? PNEUMOCOCCAL VACCINE (NEU mo KOK al vak SEEN) is a vaccine used to prevent pneumococcus bacterial infections. These bacteria can cause serious infections like pneumonia, meningitis, and blood infections. This vaccine will lower your chance of getting pneumonia. If you do get pneumonia, it can make your symptoms milder and your illness shorter. This vaccine will not treat an infection and will not cause infection. This vaccine is recommended for infants and young children, adults with certain medical conditions, and adults 4 years or older. This medicine may be used for other purposes; ask your health care provider or pharmacist if you have questions. COMMON BRAND NAME(S): Prevnar, Prevnar 13 What should I tell my care team before I take this medication? They need to know if you have any of these conditions: bleeding problems fever immune system problems an unusual or allergic reaction to pneumococcal vaccine, diphtheria  toxoid, other vaccines, latex, other medicines, foods, dyes, or preservatives pregnant or trying to get pregnant breast-feeding How should I use this medication? This vaccine is for injection into a muscle. It is given by a health care professional. A copy of Vaccine Information Statements will be given before each vaccination. Read this sheet carefully each time. The sheet may change frequently. Talk to your pediatrician regarding the use of this medicine in children. While this drug may be prescribed for children as young as 53 weeks old for selected conditions, precautions do apply. Overdosage: If you think you have taken too much of this medicine  contact a poison control center or emergency room at once. NOTE: This medicine is only for you. Do not share this medicine with others. What if I miss a dose? It is important not to miss your dose. Call your doctor or health care professional if you are unable to keep an appointment. What may interact with this medication? medicines for cancer chemotherapy medicines that suppress your immune function steroid medicines like prednisone or cortisone This list may not describe all possible interactions. Give your health care provider a list of all the medicines, herbs, non-prescription drugs, or dietary supplements you use. Also tell them if you smoke, drink alcohol, or use illegal drugs. Some items may interact with your medicine. What should I watch for while using this medication? Mild fever and pain should go away in 3 days or less. Report any unusual symptoms to your doctor or health care professional. What side effects may I notice from receiving this medication? Side effects that you should report to your doctor or health care professional as soon as possible: allergic reactions like skin rash, itching or hives, swelling of the face, lips, or tongue breathing problems confused fast or irregular heartbeat fever over 102 degrees F seizures unusual bleeding or bruising unusual muscle weakness Side effects that usually do not require medical attention (report to your doctor or health care professional if they continue or are bothersome): aches and pains diarrhea fever of 102 degrees F or less headache irritable loss of appetite pain, tender at site where injected trouble sleeping This list may not describe all possible side effects. Call your doctor for medical advice about side effects. You may report side effects to FDA at 1-800-FDA-1088. Where should I keep my medication? This does not apply. This vaccine is given in a clinic, pharmacy, doctor's office, or other health care setting  and will not be stored at home. NOTE: This sheet is a summary. It may not cover all possible information. If you have questions about this medicine, talk to your doctor, pharmacist, or health care provider.  2022 Elsevier/Gold Standard (2014-09-23 00:00:00)

## 2021-12-21 NOTE — Progress Notes (Signed)
Chief Complaint  Patient presents with   Annual Exam   Annual  1. Dm 2 a1c 6.3 on metformin 500 bp controlled lis 10 mg qd on crestor 5 mg qhs  Eye exam 12/2021  2.declines hiv, hep C 3. H/o breast cancer [facility] seen 08/2021 no recurrent f/u in 1 year   Review of Systems  Constitutional:  Negative for weight loss.  HENT:  Negative for hearing loss.   Eyes:  Negative for blurred vision.  Respiratory:  Negative for shortness of breath.   Cardiovascular:  Negative for chest pain.  Gastrointestinal:  Negative for abdominal pain and blood in stool.  Genitourinary:  Negative for dysuria.  Musculoskeletal:  Negative for falls and joint pain.  Skin:  Negative for rash.  Neurological:  Negative for headaches.  Psychiatric/Behavioral:  Negative for depression.   Past Medical History:  Diagnosis Date   Allergy    Basal cell carcinoma 06/12/2017   Central chest lat edge of scar    Cancer ([place])    breast cancer dcis s/p double mastectomy    Chicken pox    COVID-19    11/2020 like allergies no smell/taste as of 04/04/21 back    Depression    Diabetes mellitus without complication (HCC)    X2J 7.7    Dysplastic nevus 01/30/2008   R mid back - moderate, excision 02/26/2008   Endometriosis    GERD (gastroesophageal reflux disease)    Hx of migraines    Hyperlipidemia    Hypertension    Past Surgical History:  Procedure Laterality Date   ABDOMINAL HYSTERECTOMY     Bilateral nipple sparing mastectomies     Dr Charlane Ferretti   BREAST SURGERY     breast bx DCIS s/p double mastectomy and breast reconstruction    [place], 2006, 2008; 2 miscarriages   CHOLECYSTECTOMY     LEEP     1996   MOHS SURGERY     right leg BCC Dr. Lacinda Axon removed 12/20/21   Family History  Problem Relation Age of Onset   Asthma Mother    COPD Mother    Depression Mother    Diabetes Mother    Early death Father    Hypertension Sister    Alcohol abuse Maternal Grandmother    Cancer  Maternal Grandmother        breast   Alcohol abuse Maternal Grandfather    Cancer Maternal Grandfather        lung   Cancer Paternal Grandmother        brain    Heart disease Paternal Grandfather    COPD Sister    Depression Sister    Social History   Socioeconomic History   Marital status: Married    Spouse name: Not on file   Number of children: Not on file   Years of education: Not on file   Highest education level: Not on file  Occupational History   Not on file  Tobacco Use   Smoking status: Former   Smokeless tobacco: Never   Tobacco comments:    former x 7 years max 1 ppd   Substance and Sexual Activity   Alcohol use: Yes   Drug use: Not Currently   Sexual activity: Yes    Comment: men  Other Topics Concern   Not on file  Social History Narrative   Married with kids    Some college    Customer service rep  Owns guns, wears seat belt, safe in relationship    Works [place] Strain: Not on [employer] Insecurity: Not on file  Transportation Needs: Not on file  Physical Activity: Not on file  Stress: Not on file  Social Connections: Not on file  Intimate Partner Violence: Not on file   Current Meds  Medication Sig   busPIRone (BUSPAR) 5 MG tablet TAKE 1 TABLET TWICE A DAY   cephALEXin (KEFLEX) 500 MG capsule Take 1 capsule by mouth 2 (two) times daily.   citalopram (CELEXA) 40 MG tablet TAKE 1 TABLET DAILY   lisinopril (ZESTRIL) 10 MG tablet Take 1 tablet (10 mg total) by mouth in the morning and at bedtime. New Rx change in sig will use old Rx for 45 days will need new Rx in 45 days   metFORMIN (GLUCOPHAGE) 500 MG tablet Take 1 tablet (500 mg total) by mouth 2 (two) times daily with a meal.   ondansetron (ZOFRAN-ODT) 4 MG disintegrating tablet Take 1 tablet (4 mg total) by mouth 2 (two) times daily as needed for nausea or vomiting.   pneumococcal 13-valent conjugate vaccine (PREVNAR 13) SUSP  injection Inject 0.5 mLs into the muscle once for 1 dose.   rosuvastatin (CRESTOR) 5 MG tablet TAKE 1 TABLET AT BEDTIME   Semaglutide,0.25 or 0.5MG/DOS, (OZEMPIC, 0.25 OR 0.5 MG/DOSE,) 2 MG/1.5ML SOPN Inject 0.25 mg into the skin once a week.   Tdap (BOOSTRIX) 5-2.5-18.5 LF-MCG/0.5 injection Inject 0.5 mLs into the muscle once for 1 dose.   valACYclovir (VALTREX) 1000 MG tablet Take 1 tablet (1,000 mg total) by mouth 2 (two) times daily. X 3-7 days with outbreak as needed   No Known Allergies No results found for this or any previous visit (from the past 2160 hour(s)). Objective  Body mass index is 34.02 kg/m. Wt Readings from Last 3 Encounters:  12/21/21 186 lb (84.4 kg)  04/04/21 166 lb (75.3 kg)  11/03/20 174 lb 12.8 oz (79.3 kg)   Temp Readings from Last 3 Encounters:  12/21/21 (!) 97.2 F (36.2 C) (Temporal)  04/04/21 97.9 F (36.6 C) (Oral)  11/03/20 98.5 F (36.9 C) (Oral)   BP Readings from Last 3 Encounters:  12/21/21 130/82  04/04/21 (!) 150/100  11/03/20 116/86   Pulse Readings from Last 3 Encounters:  12/21/21 90  04/04/21 85  11/03/20 99    Physical Exam Vitals and nursing note reviewed.  Constitutional:      Appearance: Normal appearance. She is well-developed and well-groomed.  HENT:     Head: Normocephalic and atraumatic.  Eyes:     Conjunctiva/sclera: Conjunctivae normal.     Pupils: Pupils are equal, round, and reactive to light.  Cardiovascular:     Rate and Rhythm: Normal rate and regular rhythm.     Heart sounds: Normal heart sounds. No murmur heard. Pulmonary:     Effort: Pulmonary effort is normal.     Breath sounds: Normal breath sounds.  Abdominal:     General: Abdomen is flat. Bowel sounds are normal.     Tenderness: There is no abdominal tenderness.  Musculoskeletal:        General: No tenderness.  Skin:    General: Skin is warm and dry.  Neurological:     General: No focal deficit present.     Mental Status: She is alert and  oriented to person, place, and time. Mental status is at baseline.  Cranial Nerves: Cranial nerves 2-12 are intact.     Gait: Gait is intact.  Psychiatric:        Attention and Perception: Attention and perception normal.        Mood and Affect: Mood and affect normal.        Speech: Speech normal.        Behavior: Behavior normal. Behavior is cooperative.        Thought Content: Thought content normal.        Cognition and Memory: Cognition and memory normal.        Judgment: Judgment normal.    Assessment  Plan  Annual physical exam See below   Hypertension associated with diabetes ([place]) - Plan: Comprehensive metabolic panel, Lipid panel, Hemoglobin A1c, CBC with Differential/Platelet, Semaglutide,0.25 or 0.5MG/DOS, (OZEMPIC, 0.25 OR 0.5 MG/DOSE,) 2 MG/1.5ML SOPN continue metformin 500 for now  Ozempic 0.25 weekly  Nausea - Plan: ondansetron (ZOFRAN-ODT) 4 MG disintegrating tablet with ozempic  Vitamin D deficiency - Plan: Vitamin D (25 hydroxy)  Obesity (BMI 30-39.9)  Ozempic 0.25 weekly did not take adipex  HM Flu shot utd 09/2021 moderna 2/2 consider booster declines  -had covid 11/2020+ Consider pna 23 vaccine disc today to let me know if wanted here of pharmacy Rx prevnar and Tdap 12/21/21  Tdap in 2016 per pt consider in 2026 Consider pna 23 vaccine in the future  Consider mmr given MMR Rx previously not had Hep b immune   S/p mastectomy for breast cancer h/o with [facility] Dr. Charlane Ferretti  seen 08/2020 mri 06/22/20 mri negative except benign left lymph node -->h/o breast cancer pt will f/u 08/2021 [facility]   Pap 05/06/15 negative pap s/p hysterectomy q5 years had [facility] h/o abnormal pap h/o LEEP 1996  -advised pt call due f/u 12/21/21    Colonoscopy age [age] Dr. Alice Reichert grade 1 [place], diverticulosis 09/02/20 f/u 10 years    Derm Dr. Phillip Heal saw 12/2018 due to see again 06/2019 had bx left lower leg  atypical mole and ln2 per pt  -bcc removed right leg BCC 12/20/21     Dr. Dione Housekeeper eye doctor saw 10/2019 or 01/2019 need to get records [id number] 08/17/20 negative    Dr. Rodman Key dentist, dental extraction 01/2021 Dr. Lewanda Rife oral surgery [place]    rec healthy diet and exercise   Declines hep C/HIV Provider: Dr. Olivia Mackie McLean-Scocuzza-[facility]

## 2021-12-22 ENCOUNTER — Other Ambulatory Visit: Payer: Self-pay | Admitting: Internal Medicine

## 2021-12-22 DIAGNOSIS — E119 Type 2 diabetes mellitus without complications: Secondary | ICD-10-CM

## 2021-12-29 MED ORDER — OZEMPIC (0.25 OR 0.5 MG/DOSE) 2 MG/1.5ML ~~LOC~~ SOPN: 0.2500 mg | PEN_INJECTOR | SUBCUTANEOUS | 2 refills | Status: DC

## 2022-01-05 DIAGNOSIS — Z0189 Encounter for other specified special examinations: Secondary | ICD-10-CM | POA: Diagnosis not present

## 2022-01-11 ENCOUNTER — Other Ambulatory Visit: Payer: Self-pay | Admitting: Internal Medicine

## 2022-01-11 ENCOUNTER — Encounter: Payer: Self-pay | Admitting: Internal Medicine

## 2022-01-11 DIAGNOSIS — E1159 Type 2 diabetes mellitus with other circulatory complications: Secondary | ICD-10-CM

## 2022-01-11 DIAGNOSIS — M3501 Sicca syndrome with keratoconjunctivitis: Secondary | ICD-10-CM | POA: Diagnosis not present

## 2022-01-11 MED ORDER — OZEMPIC (0.25 OR 0.5 MG/DOSE) 2 MG/1.5ML ~~LOC~~ SOPN: 0.5000 mg | PEN_INJECTOR | SUBCUTANEOUS | 2 refills | Status: DC

## 2022-01-11 NOTE — Telephone Encounter (Signed)
Sig: Inject 0.25 mg into the skin once a week.  Please advise, Patient currently on 0.25 mg weekly. Should this be increased?

## 2022-01-12 DIAGNOSIS — N3000 Acute cystitis without hematuria: Secondary | ICD-10-CM | POA: Diagnosis not present

## 2022-01-18 ENCOUNTER — Other Ambulatory Visit: Payer: Self-pay

## 2022-01-18 ENCOUNTER — Encounter: Payer: Self-pay | Admitting: Dermatology

## 2022-01-18 ENCOUNTER — Ambulatory Visit: Payer: BC Managed Care – PPO | Admitting: Dermatology

## 2022-01-18 DIAGNOSIS — L578 Other skin changes due to chronic exposure to nonionizing radiation: Secondary | ICD-10-CM

## 2022-01-18 DIAGNOSIS — L821 Other seborrheic keratosis: Secondary | ICD-10-CM

## 2022-01-18 DIAGNOSIS — L988 Other specified disorders of the skin and subcutaneous tissue: Secondary | ICD-10-CM

## 2022-01-18 DIAGNOSIS — Z85828 Personal history of other malignant neoplasm of skin: Secondary | ICD-10-CM | POA: Diagnosis not present

## 2022-01-18 DIAGNOSIS — D229 Melanocytic nevi, unspecified: Secondary | ICD-10-CM

## 2022-01-18 DIAGNOSIS — Z86018 Personal history of other benign neoplasm: Secondary | ICD-10-CM

## 2022-01-18 DIAGNOSIS — Z1283 Encounter for screening for malignant neoplasm of skin: Secondary | ICD-10-CM | POA: Diagnosis not present

## 2022-01-18 DIAGNOSIS — L814 Other melanin hyperpigmentation: Secondary | ICD-10-CM

## 2022-01-18 DIAGNOSIS — D1800 Hemangioma unspecified site: Secondary | ICD-10-CM

## 2022-01-18 NOTE — Patient Instructions (Addendum)
Recommend Serica moisturizing scar formula cream every night or [facility] brand or Mederma silicone scar sheet every night for the first year after a scar appears to help with scar remodeling if desired. Scars remodel on their own for a full year.  Instructions for [facility] Medications  One or more of your medications was sent to the [facility] mail order compounding pharmacy. You will receive an email from them and can purchase the medicine through that link. It will then be mailed to your home at the address you confirmed. If for any reason you do not receive an email from them, please check your spam folder. If you still do not find the email, please let us know. [facility] phone number is [phone].  Will prescribe [facility] Anti-Aging Tretinoin 0.025%/Niacinamide/Vitamin C/Vitamin E/Turmeric/Resveratrol with Hyaluronic Acid. Apply pea sized amount nightly to the entire face.  The patient was advised this is not covered by insurance since it is made by a compounding pharmacy. They will receive an email to check out and the medication will be mailed to their home.   Topical retinoid medications like tretinoin can cause dryness and irritation when first started. Only apply a pea-sized amount to the entire affected area. Avoid applying it around the eyes, edges of mouth and creases at the nose. If you experience irritation, use a good moisturizer first and/or apply the medicine less often. If you are doing well with the medicine, you can increase how often you use it until you are applying every night. Be careful with sun protection while using this medication as it can make you sensitive to the sun. This medicine should not be used by pregnant women.    Melanoma ABCDEs  Melanoma is the most dangerous type of skin cancer, and is the leading cause of death from skin disease.  You are more likely to develop melanoma if you: Have light-colored skin, light-colored eyes, or red or  blond hair Spend a lot of time in the sun Tan regularly, either outdoors or in a tanning bed Have had blistering sunburns, especially during childhood Have a close family member who has had a melanoma Have atypical moles or large birthmarks  Early detection of melanoma is key since treatment is typically straightforward and cure rates are extremely high if we catch it early.   The first sign of melanoma is often a change in a mole or a new dark spot.  The ABCDE system is a way of remembering the signs of melanoma.  A for asymmetry:  The two halves do not match. B for border:  The edges of the growth are irregular. C for color:  A mixture of colors are present instead of an even brown color. D for diameter:  Melanomas are usually (but not always) greater than 70mm - the size of a pencil eraser. E for evolution:  The spot keeps changing in size, shape, and color.  Please check your skin once per month between visits. You can use a small mirror in front and a large mirror behind you to keep an eye on the back side or your body.   If you see any new or changing lesions before your next follow-up, please call to schedule a visit.  Please continue daily skin protection including broad spectrum sunscreen SPF 30+ to sun-exposed areas, reapplying every 2 hours as needed when you're outdoors.    Recommend taking Heliocare sun protection supplement daily in sunny weather for additional sun protection. For maximum protection  on the sunniest days, you can take up to 2 capsules of regular Heliocare OR take 1 capsule of Heliocare Ultra. For prolonged exposure (such as a full day in the sun), you can repeat your dose of the supplement 4 hours after your first dose. Heliocare can be purchased at [facility] or at [email or web address].    If You Need Anything After Your Visit  If you have any questions or concerns for your doctor, please call our main line at [phone] and press option 4 to reach  your doctor's medical assistant. If no one answers, please leave a voicemail as directed and we will return your call as soon as possible. Messages left after 4 pm will be answered the following business day.   You may also send [place] a message via [place]. We typically respond to MyChart messages within 1-2 business days.  For prescription refills, please ask your pharmacy to contact our office. Our fax number is [phone].  If you have an urgent issue when the clinic is closed that cannot wait until the next business day, you can page your doctor at the number below.    Please note that while we do our best to be available for urgent issues outside of office hours, we are not available 24/7.   If you have an urgent issue and are unable to reach [place], you may choose to seek medical care at your doctor's office, retail clinic, urgent care center, or emergency room.  If you have a medical emergency, please immediately call 911 or go to the emergency department.  Pager Numbers  - Dr. Nehemiah Massed: [phone]  - Dr. Laurence Ferrari: [phone]  - Dr. Nicole Kindred: [phone]  In the event of inclement weather, please call our main line at [phone] for an update on the status of any delays or closures.  Dermatology Medication Tips: Please keep the boxes that topical medications come in in order to help keep track of the instructions about where and how to use these. Pharmacies typically print the medication instructions only on the boxes and not directly on the medication tubes.   If your medication is too expensive, please contact our office at [phone] option 4 or send [place] a message through [place].   We are unable to tell what your co-pay for medications will be in advance as this is different depending on your insurance coverage. However, we may be able to find a substitute medication at lower cost or fill out paperwork to get insurance to cover a needed medication.   If a prior authorization  is required to get your medication covered by your insurance company, please allow [place] 1-2 business days to complete this process.  Drug prices often vary depending on where the prescription is filled and some pharmacies may offer cheaper prices.  The website www.goodrx.com contains coupons for medications through different pharmacies. The prices here do not account for what the cost may be with help from insurance (it may be cheaper with your insurance), but the website can give you the price if you did not use any insurance.  - You can print the associated coupon and take it with your prescription to the pharmacy.  - You may also stop by our office during regular business hours and pick up a GoodRx coupon card.  - If you need your prescription sent electronically to a different pharmacy, notify our office through [facility] or by phone at [phone] option 4.     Si Usted [place]  Algo Despus de Su Visita  Tambin puede enviarnos un mensaje a travs de MyChart. Por lo general respondemos a los mensajes de MyChart en el transcurso de 1 a 2 das hbiles.  Para renovar recetas, por favor pida a su farmacia que se ponga en contacto con nuestra oficina. Harland Dingwall de fax es [place] [phone].  Si tiene un asunto urgente cuando la clnica est cerrada y que no puede esperar hasta el siguiente da hbil, puede llamar/localizar a su doctor(a) al nmero que aparece a continuacin.   Por favor, tenga en cuenta que aunque hacemos todo lo posible para estar disponibles para asuntos urgentes fuera del horario de [place], no estamos disponibles las 24 horas del da, los 7 das de la [place].   Si tiene un problema urgente y no puede comunicarse con nosotros, puede optar por buscar atencin mdica  en el consultorio de su doctor(a), en una clnica privada, en un centro de atencin urgente o en una sala de emergencias.  Si tiene Engineering geologist, por favor llame inmediatamente al 911 o  vaya a la sala de emergencias.  Nmeros de bper  - Dr. Nehemiah Massed: [phone]  - Dra. Moye: [phone]  - Dra. Nicole Kindred: [phone]  En caso de inclemencias del [place], por favor llame a Johnsie Kindred principal al [phone] para una actualizacin sobre el [place] de cualquier retraso o cierre.  Consejos para la medicacin en dermatologa: Por favor, guarde las cajas en las que vienen los medicamentos de uso tpico para ayudarle a seguir las instrucciones sobre dnde y cmo usarlos. Las farmacias generalmente imprimen las instrucciones del medicamento slo en las cajas y no directamente en los tubos del [place].   Si su medicamento es muy caro, por favor, pngase en contacto con Zigmund Daniel llamando al [phone] y presione la opcin 4 o envenos un mensaje a travs de Pharmacist, community.   No podemos decirle cul ser su copago por los medicamentos por adelantado ya que esto es diferente dependiendo de la cobertura de su seguro. Sin embargo, es posible que podamos encontrar un medicamento sustituto a Electrical engineer un formulario para que el seguro cubra el medicamento que se considera necesario.   Si se requiere una autorizacin previa para que su compaa de seguros [place] su medicamento, por favor permtanos de 1 a 2 das hbiles para completar este proceso.  Los precios de los medicamentos varan con frecuencia dependiendo del Environmental consultant de dnde se surte la receta y alguna farmacias pueden ofrecer precios ms baratos.  El sitio web www.goodrx.com tiene cupones para medicamentos de Airline pilot. Los precios aqu no tienen en cuenta lo que podra costar con la ayuda del seguro (puede ser ms barato con su seguro), pero el sitio web puede darle el precio si no utiliz Research scientist (physical sciences).  - Puede imprimir el cupn correspondiente y llevarlo con su receta a la farmacia.  - Tambin puede pasar por nuestra oficina durante el horario de atencin regular y Charity fundraiser una tarjeta de cupones  de GoodRx.  - Si necesita que su receta se enve electrnicamente a una farmacia diferente, informe a nuestra oficina a travs de MyChart de [place] o por telfono llamando al [phone] y presione la opcin 4.

## 2022-01-18 NOTE — Progress Notes (Signed)
Follow-Up Visit   Subjective  Sandra Griffin is a 51 y.o. female who presents for the following: FBSE (Patient here for full body skin exam and skin cancer screening. Patient with hx of BCC, dysplastic nevus. Patient is not aware of any new or changing spots. ).   The following portions of the chart were reviewed this encounter and updated as appropriate:   Tobacco   Allergies   Meds   Problems   Med Hx   Surg Hx   Fam Hx       Review of Systems:  No other skin or systemic complaints except as noted in HPI or Assessment and Plan.  Objective  Well appearing patient in no apparent distress; mood and affect are within normal limits.  A full examination was performed including scalp, head, eyes, ears, nose, lips, neck, chest, axillae, abdomen, back, buttocks, bilateral upper extremities, bilateral lower extremities, hands, feet, fingers, toes, fingernails, and toenails. All findings within normal limits unless otherwise noted below.  face Rhytides and volume loss.     Assessment & Plan  Elastosis of skin face  Recommend Alastin Skin Restorative Complex  Will prescribe [facility] Anti-Aging Tretinoin 0.025%/Niacinamide/Vitamin C/Vitamin E/Turmeric/Resveratrol with Hyaluronic Acid. Apply pea sized amount nightly to the entire face.  The patient was advised this is not covered by insurance since it is made by a compounding pharmacy. They will receive an email to check out and the medication will be mailed to their home.   Topical retinoid medications like tretinoin can cause dryness and irritation when first started. Only apply a pea-sized amount to the entire affected area. Avoid applying it around the eyes, edges of mouth and creases at the nose. If you experience irritation, use a good moisturizer first and/or apply the medicine less often. If you are doing well with the medicine, you can increase how often you use it until you are applying every night. Be careful with sun  protection while using this medication as it can make you sensitive to the sun. This medicine should not be used by pregnant women.     Lentigines - Scattered tan macules - Due to sun exposure - Benign-appearing, observe - Recommend daily broad spectrum sunscreen SPF 30+ to sun-exposed areas, reapply every 2 hours as needed. - Call for any changes  Seborrheic Keratoses - Stuck-on, waxy, tan-brown papules and/or plaques  - Benign-appearing - Discussed benign etiology and prognosis. - Observe - Call for any changes  Melanocytic Nevi - Tan-brown and/or pink-flesh-colored symmetric macules and papules - Benign appearing on exam today - Observation - Call clinic for new or changing moles - Recommend daily use of broad spectrum spf 30+ sunscreen to sun-exposed areas.   Hemangiomas - Red papules - Discussed benign nature - Observe - Call for any changes  Actinic Damage - Chronic condition, secondary to cumulative UV/sun exposure - diffuse scaly erythematous macules with underlying dyspigmentation - Recommend daily broad spectrum sunscreen SPF 30+ to sun-exposed areas, reapply every 2 hours as needed.  - Staying in the shade or wearing long sleeves, sun glasses (UVA+UVB protection) and wide brim hats (4-inch brim around the entire circumference of the hat) are also recommended for sun protection.  - Call for new or changing lesions.  Skin cancer screening performed today.  History of Basal Cell Carcinoma of the Skin - No evidence of recurrence today - Recommend regular full body skin exams - Recommend daily broad spectrum sunscreen SPF 30+ to sun-exposed areas, reapply every 2  hours as needed.  - Call if any new or changing lesions are noted between office visits  History of Dysplastic Nevi - No evidence of recurrence today - Recommend regular full body skin exams - Recommend daily broad spectrum sunscreen SPF 30+ to sun-exposed areas, reapply every 2 hours as needed.  -  Call if any new or changing lesions are noted between office visits  Return for TBSE 6-12 months.  Graciella Belton, RMA, am acting as scribe for Forest Gleason, MD .  Documentation: I have reviewed the above documentation for accuracy and completeness, and I agree with the above.  Forest Gleason, MD

## 2022-01-29 ENCOUNTER — Encounter: Payer: Self-pay | Admitting: Dermatology

## 2022-03-13 ENCOUNTER — Other Ambulatory Visit: Payer: Self-pay | Admitting: Internal Medicine

## 2022-03-13 DIAGNOSIS — E1159 Type 2 diabetes mellitus with other circulatory complications: Secondary | ICD-10-CM

## 2022-03-16 LAB — HM DIABETES EYE EXAM

## 2022-03-27 ENCOUNTER — Encounter: Payer: Self-pay | Admitting: Internal Medicine

## 2022-03-27 NOTE — Telephone Encounter (Signed)
Please advise, okay to send in '1mg'$  to local pharmacy?  ?

## 2022-03-28 ENCOUNTER — Other Ambulatory Visit: Payer: Self-pay | Admitting: Internal Medicine

## 2022-03-28 DIAGNOSIS — E669 Obesity, unspecified: Secondary | ICD-10-CM

## 2022-03-28 DIAGNOSIS — E1159 Type 2 diabetes mellitus with other circulatory complications: Secondary | ICD-10-CM

## 2022-03-28 DIAGNOSIS — I152 Hypertension secondary to endocrine disorders: Secondary | ICD-10-CM

## 2022-03-28 DIAGNOSIS — E119 Type 2 diabetes mellitus without complications: Secondary | ICD-10-CM

## 2022-03-28 MED ORDER — SEMAGLUTIDE (1 MG/DOSE) 4 MG/3ML ~~LOC~~ SOPN: 1.0000 mg | PEN_INJECTOR | SUBCUTANEOUS | 5 refills | Status: DC

## 2022-04-26 ENCOUNTER — Encounter: Payer: Self-pay | Admitting: Internal Medicine

## 2022-04-26 DIAGNOSIS — Z6834 Body mass index (BMI) 34.0-34.9, adult: Secondary | ICD-10-CM | POA: Insufficient documentation

## 2022-04-30 ENCOUNTER — Encounter: Payer: Self-pay | Admitting: Internal Medicine

## 2022-05-01 ENCOUNTER — Other Ambulatory Visit: Payer: Self-pay | Admitting: Internal Medicine

## 2022-05-01 DIAGNOSIS — Z6834 Body mass index (BMI) 34.0-34.9, adult: Secondary | ICD-10-CM

## 2022-05-01 DIAGNOSIS — E1159 Type 2 diabetes mellitus with other circulatory complications: Secondary | ICD-10-CM

## 2022-05-01 DIAGNOSIS — E785 Hyperlipidemia, unspecified: Secondary | ICD-10-CM

## 2022-05-01 MED ORDER — SEMAGLUTIDE (2 MG/DOSE) 8 MG/3ML ~~LOC~~ SOPN: 2.0000 mg | PEN_INJECTOR | SUBCUTANEOUS | 5 refills | Status: DC

## 2022-05-02 ENCOUNTER — Other Ambulatory Visit: Payer: Self-pay | Admitting: Internal Medicine

## 2022-05-02 DIAGNOSIS — B009 Herpesviral infection, unspecified: Secondary | ICD-10-CM

## 2022-05-03 ENCOUNTER — Telehealth: Payer: Self-pay | Admitting: Internal Medicine

## 2022-05-03 ENCOUNTER — Encounter: Payer: Self-pay | Admitting: Internal Medicine

## 2022-05-03 NOTE — Telephone Encounter (Signed)
Ive ordered labs with [facility] or your job have you done them yet fasting?  ?Your insurance is asking for notes and labs and I dont have any recent labs on you  ?Can you do these asap  ?Also not sure the prior authorization will be done by Friday its a process it you got the 1 mg weekly approved continue this is you are pressed for time due to going out of town  ?We will also call you if we have a sample ozempic 2 mg weekly preferred but if have 1 mg weekly but 1st ask did she get from the pharmacy 1 mg ? ? ?

## 2022-05-03 NOTE — Telephone Encounter (Signed)
Lvm for pt to return call to see if pt has had fasting labs done w/[facility] or with her employer? ?If no please have them done asap, insurance in requiring notes/labs.  ?Informed via vm that PA will not be done by Friday since it's a process.  ?If she was able to get the 1 mg of ozempic approved to continue that since time is of the essence. ?We don't have ozempic samples for 2 mg so continue 1 mg. ?Was she able to receive 1 mg of ozempic from pharmacy? ?

## 2022-05-07 NOTE — Telephone Encounter (Signed)
Approval for Ozempic Semaglutide, 2 MG/DOSE, 8 MG/3ML SOPN Effective from 05/03/22 to 05/03/23 ?

## 2022-06-26 ENCOUNTER — Ambulatory Visit: Payer: BC Managed Care – PPO | Admitting: Internal Medicine

## 2022-07-08 ENCOUNTER — Other Ambulatory Visit: Payer: Self-pay | Admitting: Internal Medicine

## 2022-07-08 DIAGNOSIS — I10 Essential (primary) hypertension: Secondary | ICD-10-CM

## 2022-07-10 ENCOUNTER — Other Ambulatory Visit: Payer: Self-pay | Admitting: Internal Medicine

## 2022-07-10 ENCOUNTER — Encounter: Payer: Self-pay | Admitting: Internal Medicine

## 2022-07-10 DIAGNOSIS — E785 Hyperlipidemia, unspecified: Secondary | ICD-10-CM

## 2022-07-10 DIAGNOSIS — Z6834 Body mass index (BMI) 34.0-34.9, adult: Secondary | ICD-10-CM

## 2022-07-10 DIAGNOSIS — R110 Nausea: Secondary | ICD-10-CM

## 2022-07-10 DIAGNOSIS — E1159 Type 2 diabetes mellitus with other circulatory complications: Secondary | ICD-10-CM

## 2022-07-10 DIAGNOSIS — F419 Anxiety disorder, unspecified: Secondary | ICD-10-CM

## 2022-07-10 DIAGNOSIS — I10 Essential (primary) hypertension: Secondary | ICD-10-CM

## 2022-07-10 DIAGNOSIS — F32A Depression, unspecified: Secondary | ICD-10-CM

## 2022-07-10 DIAGNOSIS — B009 Herpesviral infection, unspecified: Secondary | ICD-10-CM

## 2022-07-10 DIAGNOSIS — E119 Type 2 diabetes mellitus without complications: Secondary | ICD-10-CM

## 2022-07-10 MED ORDER — LISINOPRIL 10 MG PO TABS: ORAL_TABLET | ORAL | 3 refills | Status: AC

## 2022-07-10 MED ORDER — ROSUVASTATIN CALCIUM 5 MG PO TABS: 5.0000 mg | ORAL_TABLET | Freq: Every day | ORAL | 3 refills | Status: AC

## 2022-07-10 MED ORDER — METFORMIN HCL 500 MG PO TABS: 500.0000 mg | ORAL_TABLET | Freq: Two times a day (BID) | ORAL | 3 refills | Status: DC

## 2022-07-10 MED ORDER — BUSPIRONE HCL 5 MG PO TABS: 5.0000 mg | ORAL_TABLET | Freq: Two times a day (BID) | ORAL | 3 refills | Status: DC

## 2022-07-10 MED ORDER — CITALOPRAM HYDROBROMIDE 40 MG PO TABS: 40.0000 mg | ORAL_TABLET | Freq: Every day | ORAL | 3 refills | Status: AC

## 2022-07-10 MED ORDER — VALACYCLOVIR HCL 1 G PO TABS: ORAL_TABLET | ORAL | 7 refills | Status: DC

## 2022-07-10 MED ORDER — SEMAGLUTIDE (2 MG/DOSE) 8 MG/3ML ~~LOC~~ SOPN: 2.0000 mg | PEN_INJECTOR | SUBCUTANEOUS | 11 refills | Status: AC

## 2022-07-10 MED ORDER — ONDANSETRON 4 MG PO TBDP: 4.0000 mg | ORAL_TABLET | Freq: Two times a day (BID) | ORAL | 0 refills | Status: DC | PRN

## 2022-07-12 ENCOUNTER — Encounter: Payer: BC Managed Care – PPO | Admitting: Dermatology

## 2022-08-28 ENCOUNTER — Encounter: Payer: Self-pay | Admitting: Internal Medicine

## 2022-08-29 ENCOUNTER — Telehealth: Payer: Self-pay

## 2022-08-29 NOTE — Telephone Encounter (Signed)
LMOM in regards to her mychart msg about getting a copy of her vaccine record I was calling to see if she would like it mailed to her or to come pick it up from office. I also replied to her MyChart msg asking the same ?'s

## 2022-09-13 ENCOUNTER — Telehealth: Payer: Self-pay | Admitting: Internal Medicine

## 2022-09-13 NOTE — Telephone Encounter (Signed)
We have been trying to do labs since 11/2021 please sch before her insurance stops covering ozempic as they require labs as proof you still need it   Labs in just needs appt fasting

## 2022-09-14 ENCOUNTER — Telehealth: Payer: Self-pay

## 2022-09-14 NOTE — Telephone Encounter (Signed)
LMOM for pt to CB.  

## 2022-09-14 NOTE — Telephone Encounter (Signed)
Pt is sched for labs 9/18

## 2022-09-14 NOTE — Telephone Encounter (Signed)
LMOM for pt to CB in regards to her needing to come get labs in order for her insurance to keep covering her ozempic:   McLean-Scocuzza, Nino Glow, MD 16 hours ago (4:58 PM)   TM We have been trying to do labs since 11/2021 please sch before her insurance stops covering ozempic as they require labs as proof you still need it    Labs in just needs appt fasting

## 2022-09-17 ENCOUNTER — Other Ambulatory Visit: Payer: Self-pay | Admitting: Internal Medicine

## 2022-09-17 ENCOUNTER — Other Ambulatory Visit (INDEPENDENT_AMBULATORY_CARE_PROVIDER_SITE_OTHER): Payer: Managed Care, Other (non HMO)

## 2022-09-17 DIAGNOSIS — E1159 Type 2 diabetes mellitus with other circulatory complications: Secondary | ICD-10-CM | POA: Diagnosis not present

## 2022-09-17 DIAGNOSIS — I152 Hypertension secondary to endocrine disorders: Secondary | ICD-10-CM

## 2022-09-17 DIAGNOSIS — E559 Vitamin D deficiency, unspecified: Secondary | ICD-10-CM | POA: Diagnosis not present

## 2022-09-17 LAB — CBC WITH DIFFERENTIAL/PLATELET
Basophils Absolute: 0.1 10*3/uL (ref 0.0–0.1)
Basophils Relative: 1.2 % (ref 0.0–3.0)
Eosinophils Absolute: 0.1 10*3/uL (ref 0.0–0.7)
Eosinophils Relative: 2.7 % (ref 0.0–5.0)
HCT: 32.7 % — ABNORMAL LOW (ref 36.0–46.0)
Hemoglobin: 11.2 g/dL — ABNORMAL LOW (ref 12.0–15.0)
Lymphocytes Relative: 31.3 % (ref 12.0–46.0)
Lymphs Abs: 1.4 10*3/uL (ref 0.7–4.0)
MCHC: 34.4 g/dL (ref 30.0–36.0)
MCV: 90.2 fl (ref 78.0–100.0)
Monocytes Absolute: 0.4 10*3/uL (ref 0.1–1.0)
Monocytes Relative: 8.1 % (ref 3.0–12.0)
Neutro Abs: 2.5 10*3/uL (ref 1.4–7.7)
Neutrophils Relative %: 56.7 % (ref 43.0–77.0)
Platelets: 249 10*3/uL (ref 150.0–400.0)
RBC: 3.62 Mil/uL — ABNORMAL LOW (ref 3.87–5.11)
RDW: 13.7 % (ref 11.5–15.5)
WBC: 4.5 10*3/uL (ref 4.0–10.5)

## 2022-09-17 LAB — COMPREHENSIVE METABOLIC PANEL
ALT: 18 U/L (ref 0–35)
AST: 12 U/L (ref 0–37)
Albumin: 3.9 g/dL (ref 3.5–5.2)
Alkaline Phosphatase: 84 U/L (ref 39–117)
BUN: 13 mg/dL (ref 6–23)
CO2: 26 mEq/L (ref 19–32)
Calcium: 9 mg/dL (ref 8.4–10.5)
Chloride: 104 mEq/L (ref 96–112)
Creatinine, Ser: 0.9 mg/dL (ref 0.40–1.20)
GFR: 75.43 mL/min (ref >60.00)
Glucose, Bld: 159 mg/dL — ABNORMAL HIGH (ref 70–99)
Potassium: 4.1 mEq/L (ref 3.5–5.1)
Sodium: 138 mEq/L (ref 135–145)
Total Bilirubin: 0.3 mg/dL (ref 0.2–1.2)
Total Protein: 6.5 g/dL (ref 6.0–8.3)

## 2022-09-17 LAB — VITAMIN D 25 HYDROXY (VIT D DEFICIENCY, FRACTURES): VITD: 41.06 ng/mL (ref 30.00–100.00)

## 2022-09-17 LAB — LIPID PANEL
Cholesterol: 126 mg/dL (ref 0–200)
HDL: 48.2 mg/dL (ref >39.00)
LDL Cholesterol: 47 mg/dL (ref 0–99)
NonHDL: 77.31
Total CHOL/HDL Ratio: 3
Triglycerides: 152 mg/dL — ABNORMAL HIGH (ref 0.0–149.0)
VLDL: 30.4 mg/dL (ref 0.0–40.0)

## 2022-09-17 LAB — HEMOGLOBIN A1C: Hgb A1c MFr Bld: 7.6 % — ABNORMAL HIGH (ref 4.6–6.5)

## 2022-09-17 MED ORDER — METFORMIN HCL ER 500 MG PO TB24: 500.0000 mg | ORAL_TABLET | Freq: Two times a day (BID) | ORAL | 3 refills | Status: AC

## 2022-09-17 NOTE — Addendum Note (Signed)
Addended by: Leeanne Rio on: 09/17/2022 06:47 AM   Modules accepted: Orders

## 2022-09-20 ENCOUNTER — Telehealth: Payer: Self-pay

## 2022-09-20 ENCOUNTER — Encounter: Payer: Self-pay | Admitting: Internal Medicine

## 2022-09-20 NOTE — Telephone Encounter (Signed)
Today (9/21) I have faxed Completed Medication Prior Authorization form to [facility] with pts labs as well;   To fax number: [phone]  With a completed trans log.

## 2022-09-21 ENCOUNTER — Telehealth: Payer: Self-pay

## 2022-09-21 ENCOUNTER — Other Ambulatory Visit: Payer: Self-pay | Admitting: Internal Medicine

## 2022-09-21 DIAGNOSIS — R110 Nausea: Secondary | ICD-10-CM

## 2022-09-21 MED ORDER — ONDANSETRON 4 MG PO TBDP: 4.0000 mg | ORAL_TABLET | Freq: Two times a day (BID) | ORAL | 0 refills | Status: AC | PRN

## 2022-09-21 NOTE — Telephone Encounter (Signed)
LMOM stating that she has been approved for ozempic    Medication: Ozempic 2/mg/0.47m pen injctr  Status: Approved  Effective dates: 09/18/22-09/18/2023  Letter has also been scanned into pts chart.

## 2022-10-25 ENCOUNTER — Encounter: Payer: Self-pay | Admitting: Dermatology

## 2022-10-25 ENCOUNTER — Other Ambulatory Visit: Payer: Self-pay

## 2022-10-25 ENCOUNTER — Ambulatory Visit: Payer: Managed Care, Other (non HMO) | Admitting: Dermatology

## 2022-10-25 DIAGNOSIS — L719 Rosacea, unspecified: Secondary | ICD-10-CM | POA: Diagnosis not present

## 2022-10-25 DIAGNOSIS — L578 Other skin changes due to chronic exposure to nonionizing radiation: Secondary | ICD-10-CM

## 2022-10-25 DIAGNOSIS — L570 Actinic keratosis: Secondary | ICD-10-CM | POA: Diagnosis not present

## 2022-10-25 MED ORDER — RHOFADE 1 % EX CREA: TOPICAL_CREAM | CUTANEOUS | 3 refills | Status: AC

## 2022-10-25 MED ORDER — RHOFADE 1 % EX CREA: TOPICAL_CREAM | CUTANEOUS | 3 refills | Status: DC

## 2022-10-25 MED ORDER — FLUOROURACIL 5 % EX CREA: TOPICAL_CREAM | CUTANEOUS | 0 refills | Status: AC

## 2022-10-25 MED ORDER — METRONIDAZOLE 0.75 % EX CREA: TOPICAL_CREAM | Freq: Two times a day (BID) | CUTANEOUS | 3 refills | Status: AC

## 2022-10-25 NOTE — Progress Notes (Signed)
Follow-Up Visit   Subjective  Sandra Griffin is a 52 y.o. female who presents for the following: Irregular skin lesion (On the R temple and forehead - present for 2 months, irregular, patient is concerned and would like them checked). Patient c/o recurrent acne papules and would like to discuss treatment options.  The following portions of the chart were reviewed this encounter and updated as appropriate:   Tobacco  Allergies  Meds  Problems  Med Hx  Surg Hx  Fam Hx      Review of Systems:  No other skin or systemic complaints except as noted in HPI or Assessment and Plan.  Objective  Well appearing patient in no apparent distress; mood and affect are within normal limits.  A focused examination was performed including the face. Relevant physical exam findings are noted in the Assessment and Plan.  R temple x 1 Erythematous thin papules/macules with gritty scale.   Face Mid-face erythema with scattered inflammatory papules    Assessment & Plan  AK (actinic keratosis) R temple x 1  Actinic keratoses are precancerous spots that appear secondary to cumulative UV radiation exposure/sun exposure over time. They are chronic with expected duration over 1 year. A portion of actinic keratoses will progress to squamous cell carcinoma of the skin. It is not possible to reliably predict which spots will progress to skin cancer and so treatment is recommended to prevent development of skin cancer.  Recommend daily broad spectrum sunscreen SPF 30+ to sun-exposed areas, reapply every 2 hours as needed.  Recommend staying in the shade or wearing long sleeves, sun glasses (UVA+UVB protection) and wide brim hats (4-inch brim around the entire circumference of the hat). Call for new or changing lesions.  Discussed LN2 vs topical chemotherapy cream.   Start 5FU BID x 1 week to aa's. If persistent after first round of treatment and healing may retreat with 5FU. Reviewed course of  treatment and expected reaction.  Patient advised to expect inflammation and crusting and advised that erosions are possible.  Patient advised to be diligent with sun protection during and after treatment. Handout with details of how to apply medication and what to expect provided. Counseled to keep medication out of reach of children and pets.   Patient prefers to use local pharmacy and if too expensive she will contact us via MyChart and we will send prescription to [facility] in [place].   fluorouracil (EFUDEX) 5 % cream - R temple x 1 Apply to aa's R temple and forehead BID x 7 days.  Rosacea Face  Chronic and persistent condition with duration or expected duration over one year. Condition is bothersome/symptomatic for patient. Currently flared.  No grittiness or irritation of eyes -  Rosacea is a chronic progressive skin condition usually affecting the face of adults, causing redness and/or acne bumps. It is treatable but not curable. It sometimes affects the eyes (ocular rosacea) as well. It may respond to topical and/or systemic medication and can flare with stress, sun exposure, alcohol, exercise, topical steroids (including hydrocortisone/cortisone 10) and some foods.  Daily application of broad spectrum spf 30+ sunscreen to face is recommended to reduce flares.  Start Metronidazole cream QD.   Start Rhofade QAM.   Patient prefers to use local pharmacy and if too expensive she will contact us via MyChart and we will send prescription to [facility].    metroNIDAZOLE (METROCREAM) 0.75 % cream - Face Apply topically 2 (two) times daily.  Oxymetazoline HCl (RHOFADE) 1 %  CREA - Face Apply a thin coat to the entire face QAM.  Actinic Damage - chronic, secondary to cumulative UV radiation exposure/sun exposure over time - diffuse scaly erythematous macules with underlying dyspigmentation - Recommend daily broad spectrum sunscreen SPF 30+ to sun-exposed areas, reapply every 2  hours as needed.  - Recommend staying in the shade or wearing long sleeves, sun glasses (UVA+UVB protection) and wide brim hats (4-inch brim around the entire circumference of the hat). - Call for new or changing lesions.  Return for appointment as scheduled.  Luther Redo, CMA, am acting as scribe for Forest Gleason, MD .  Documentation: I have reviewed the above documentation for accuracy and completeness, and I agree with the above.  Forest Gleason, MD

## 2022-10-25 NOTE — Patient Instructions (Addendum)
5-Fluorouracil Patient Education   Actinic keratoses are the dry, red scaly spots on the skin caused by sun damage. A portion of these spots can turn into skin cancer with time, and treating them can help prevent development of skin cancer.   Treatment of these spots requires removal of the defective skin cells. There are various ways to remove actinic keratoses, including freezing with liquid nitrogen, treatment with creams, or treatment with a blue light procedure in the office.   5-fluorouracil cream is a topical cream used to treat actinic keratoses. It works by interfering with the growth of abnormal fast-growing skin cells, such as actinic keratoses. These cells peel off and are replaced by healthy ones. THIS CREAM SHOULD BE KEPT OUT OF REACH OF CHILDREN AND PETS AND SHOULD NOT BE USED BY PREGNANT WOMEN.  INSTRUCTIONS FOR 5-FLUOROURACIL CREAM:   5-fluorouracil cream typically needs to be used for 7-14 days depending on the location of treatment. A thin layer should be applied twice a day to the treatment areas recommended by your physician.    Avoid contact with your eyes or nostrils. Avoid applying the cream to your eyelids or lips unless directed to apply there by your physician. Do not use 5-fluorouracil on infected or open wounds.   You will develop redness, irritation and some crusting at areas where you have pre-cancer damage/actinic keratoses. IF YOU DEVELOP PAIN, BLEEDING, OR SIGNIFICANT CRUSTING, STOP THE TREATMENT EARLY - you have already gotten a good response and the actinic keratoses should clear up well.  Wash your hands after applying 5-fluorouracil 5% cream on your skin.   A moisturizer or sunscreen with a minimum SPF 30 should be applied each morning.   Once you have finished the treatment, you can apply a thin layer of Vaseline twice a day to irritated areas to soothe and calm the areas more quickly. If you experience significant discomfort, contact your physician.  For  some patients it is necessary to repeat the treatment for best results.  SIDE EFFECTS: When using 5-fluorouracil cream, you may have mild irritation, such as redness, dryness, swelling, or a mild burning sensation. This usually resolves within 2 weeks. The more actinic keratoses you have, the more redness and inflammation you can expect during treatment. Eye irritation has been reported rarely. If this occurs, please let us know.   If you have any trouble using this cream, please send [place] a MyChart Message or call the office. If you have any other questions about this information, please do not hesitate to ask me before you leave the office or reach out on MyChart or by phone.    Due to recent changes in healthcare laws, you may see results of your pathology and/or laboratory studies on MyChart before the doctors have had a chance to review them. We understand that in some cases there may be results that are confusing or concerning to you. Please understand that not all results are received at the same time and often the doctors may need to interpret multiple results in order to provide you with the best plan of care or course of treatment. Therefore, we ask that you please give [place] 2 business days to thoroughly review all your results before contacting the office for clarification. Should we see a critical lab result, you will be contacted sooner.   If You Need Anything After Your Visit  If you have any questions or concerns for your doctor, please call our main line at [phone] and press option  4 to reach your doctor's medical assistant. If no one answers, please leave a voicemail as directed and we will return your call as soon as possible. Messages left after 4 pm will be answered the following business day.   You may also send [place] a message via [place]. We typically respond to MyChart messages within 1-2 business days.  For prescription refills, please ask your pharmacy to contact our office.  Our fax number is [phone].  If you have an urgent issue when the clinic is closed that cannot wait until the next business day, you can page your doctor at the number below.    Please note that while we do our best to be available for urgent issues outside of office hours, we are not available 24/7.   If you have an urgent issue and are unable to reach [place], you may choose to seek medical care at your doctor's office, retail clinic, urgent care center, or emergency room.  If you have a medical emergency, please immediately call 911 or go to the emergency department.  Pager Numbers  - Dr. Nehemiah Massed: [phone]  - Dr. Laurence Ferrari: [phone]  - Dr. Nicole Kindred: [phone]  In the event of inclement weather, please call our main line at [phone] for an update on the status of any delays or closures.  Dermatology Medication Tips: Please keep the boxes that topical medications come in in order to help keep track of the instructions about where and how to use these. Pharmacies typically print the medication instructions only on the boxes and not directly on the medication tubes.   If your medication is too expensive, please contact our office at [phone] option 4 or send [place] a message through [place].   We are unable to tell what your co-pay for medications will be in advance as this is different depending on your insurance coverage. However, we may be able to find a substitute medication at lower cost or fill out paperwork to get insurance to cover a needed medication.   If a prior authorization is required to get your medication covered by your insurance company, please allow [place] 1-2 business days to complete this process.  Drug prices often vary depending on where the prescription is filled and some pharmacies may offer cheaper prices.  The website www.goodrx.com contains coupons for medications through different pharmacies. The prices here do not account for what the cost may be with  help from insurance (it may be cheaper with your insurance), but the website can give you the price if you did not use any insurance.  - You can print the associated coupon and take it with your prescription to the pharmacy.  - You may also stop by our office during regular business hours and pick up a GoodRx coupon card.  - If you need your prescription sent electronically to a different pharmacy, notify our office through [facility] or by phone at [phone] option 4.     Si Usted Necesita Algo Despus de Su Visita  Tambin puede enviarnos un mensaje a travs de Pharmacist, community. Por lo general respondemos a los mensajes de MyChart en el transcurso de 1 a 2 das hbiles.  Para renovar recetas, por favor pida a su farmacia que se ponga en contacto con nuestra oficina. Harland Dingwall de fax es [place] [phone].  Si tiene un asunto urgente cuando la clnica est cerrada y que no puede esperar hasta el siguiente da hbil, puede llamar/localizar a su doctor(a) al nmero que aparece  a continuacin.   Por favor, tenga en cuenta que aunque hacemos todo lo posible para estar disponibles para asuntos urgentes fuera del horario de [place], no estamos disponibles las 24 horas del da, los 7 das de la [place].   Si tiene un problema urgente y no puede comunicarse con nosotros, puede optar por buscar atencin mdica  en el consultorio de su doctor(a), en una clnica privada, en un centro de atencin urgente o en una sala de emergencias.  Si tiene Engineering geologist, por favor llame inmediatamente al 911 o vaya a la sala de emergencias.  Nmeros de bper  - Dr. Nehemiah Massed: [phone]  - Dra. Moye: [phone]  - Dra. Nicole Kindred: [phone]  En caso de inclemencias del [place], por favor llame a Johnsie Kindred principal al [phone] para una actualizacin sobre el [place] de cualquier retraso o cierre.  Consejos para la medicacin en dermatologa: Por favor, guarde las cajas en las que vienen  los medicamentos de uso tpico para ayudarle a seguir las instrucciones sobre dnde y cmo usarlos. Las farmacias generalmente imprimen las instrucciones del medicamento slo en las cajas y no directamente en los tubos del [place].   Si su medicamento es muy caro, por favor, pngase en contacto con Zigmund Daniel llamando al [phone] y presione la opcin 4 o envenos un mensaje a travs de Pharmacist, community.   No podemos decirle cul ser su copago por los medicamentos por adelantado ya que esto es diferente dependiendo de la cobertura de su seguro. Sin embargo, es posible que podamos encontrar un medicamento sustituto a Electrical engineer un formulario para que el seguro cubra el medicamento que se considera necesario.   Si se requiere una autorizacin previa para que su compaa de seguros [place] su medicamento, por favor permtanos de 1 a 2 das hbiles para completar este proceso.  Los precios de los medicamentos varan con frecuencia dependiendo del Environmental consultant de dnde se surte la receta y alguna farmacias pueden ofrecer precios ms baratos.  El sitio web www.goodrx.com tiene cupones para medicamentos de Airline pilot. Los precios aqu no tienen en cuenta lo que podra costar con la ayuda del seguro (puede ser ms barato con su seguro), pero el sitio web puede darle el precio si no utiliz Research scientist (physical sciences).  - Puede imprimir el cupn correspondiente y llevarlo con su receta a la farmacia.  - Tambin puede pasar por nuestra oficina durante el horario de atencin regular y Charity fundraiser una tarjeta de cupones de GoodRx.  - Si necesita que su receta se enve electrnicamente a una farmacia diferente, informe a nuestra oficina a travs de MyChart de  o por telfono llamando al [phone] y presione la opcin 4.

## 2022-10-25 NOTE — Progress Notes (Signed)
RX sent to [facility] per patient request. aw

## 2022-10-29 ENCOUNTER — Encounter: Payer: Self-pay | Admitting: Dermatology

## 2022-11-12 ENCOUNTER — Encounter: Payer: Self-pay | Admitting: Family Medicine

## 2022-11-12 ENCOUNTER — Ambulatory Visit (INDEPENDENT_AMBULATORY_CARE_PROVIDER_SITE_OTHER): Payer: Managed Care, Other (non HMO) | Admitting: Family Medicine

## 2022-11-12 VITALS — BP 128/74 | HR 81 | Temp 98.7°F | Ht 62.0 in | Wt 164.0 lb

## 2022-11-12 DIAGNOSIS — F419 Anxiety disorder, unspecified: Secondary | ICD-10-CM | POA: Diagnosis not present

## 2022-11-12 DIAGNOSIS — R41840 Attention and concentration deficit: Secondary | ICD-10-CM

## 2022-11-12 DIAGNOSIS — F32A Depression, unspecified: Secondary | ICD-10-CM | POA: Diagnosis not present

## 2022-11-12 MED ORDER — BUSPIRONE HCL 10 MG PO TABS: 10.0000 mg | ORAL_TABLET | Freq: Two times a day (BID) | ORAL | Status: AC

## 2022-11-12 NOTE — Progress Notes (Signed)
SUBJECTIVE:   Chief Complaint  Patient presents with   Acute Visit    Discuss ADHD related to new job   HPI  Patient presents to clinic to discuss possible ADHD symptoms.  She recently started a new job after years of being off of workforce.  She is finding it difficult to learn new material.  Previous job was completely different than current position.  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  She finds that when she is at home she does not have this issue.  She is able to focus on things and complete things at home.  During work hours she feels she is more stressed and has more anxiety.  She would like to be tested for ADHD.    PERTINENT PMH / PSH: Depression/anxiety  OBJECTIVE:  BP 128/74 (BP Location: Left Arm, Patient Position: Sitting, Cuff Size: Normal)   Pulse 81   Temp 98.7 F (37.1 C) (Oral)   Ht '5\' 2"'$  (1.575 m)   Wt 164 lb (74.4 kg)   LMP  (LMP Unknown)   SpO2 99%   BMI 30.00 kg/m    Physical Exam Vitals reviewed.  Constitutional:      General: She is not in acute distress.    Appearance: She is not ill-appearing.  HENT:     Head: Normocephalic.     Nose: Nose normal.  Eyes:     Conjunctiva/sclera: Conjunctivae normal.  Cardiovascular:     Rate and Rhythm: Normal rate and regular rhythm.     Heart sounds: Normal heart sounds.  Pulmonary:     Effort: Pulmonary effort is normal.     Breath sounds: Normal breath sounds.  Abdominal:     General: Abdomen is flat. Bowel sounds are normal.     Palpations: Abdomen is soft.  Musculoskeletal:        General: Normal range of motion.     Cervical back: Normal range of motion.  Neurological:     Mental Status: She is alert and oriented to person, place, and time. Mental status is at baseline.  Psychiatric:        Mood and Affect: Mood normal.        Behavior: Behavior normal.        Thought Content:  Thought content normal.        Judgment: Judgment normal.       11/12/2022    3:48 PM 04/04/2021    2:54 PM 03/09/2020    2:16 PM 09/11/2019    8:22 AM 05/13/2019    2:31 PM  Depression screen PHQ 2/9  Decreased Interest 0 0 0 1 0  Down, Depressed, Hopeless 0 0 0 1 0  PHQ - 2 Score 0 0 0 2 0  Altered sleeping  2  1   Tired, decreased energy  2  1   Change in appetite  0  1   Feeling bad or failure about yourself   0  1   Trouble concentrating  0  1   Moving slowly or fidgety/restless  0  0   Suicidal thoughts  0  0   PHQ-9 Score  4  7   Difficult doing work/chores  Not difficult at all  Not difficult at all        04/04/2021    2:55 PM 03/09/2020    2:16 PM  GAD 7 : Generalized Anxiety Score  Nervous, Anxious, on Edge 0 0  Control/stop worrying 0 0  Worry too much - different things 0 0  Trouble relaxing 0 0  Restless 0 0  Easily annoyed or irritable 0 0  Afraid - awful might happen 0 0  Total GAD 7 Score 0 0  Anxiety Difficulty Not difficult at all Not difficult at all      ASSESSMENT/PLAN:  Attention or concentration deficit Assessment & Plan: Suspect situational anxiety given new job with increased demand, expectations and new learning curve.  Orders: -     Ambulatory referral to Psychology  Anxiety Assessment & Plan: Well-controlled currently on 5 mg of BuSpar twice daily Will increase dosing to help with situational anxiety at work.  Orders: -     busPIRone HCl; Take 1 tablet (10 mg total) by mouth 2 (two) times daily.  Depression, unspecified depression type Assessment & Plan: Well-controlled. Continue Celexa 40 mg daily   Orders: -     busPIRone HCl; Take 1 tablet (10 mg total) by mouth 2 (two) times daily.   PDMP reviewed  Return in about 3 months (around 02/12/2023).  Carollee Leitz, MD

## 2022-11-12 NOTE — Patient Instructions (Addendum)
It was a pleasure meeting you today. Thank you for allowing me to take part in your health care.  Our goals for today as we discussed include:  Referral sent to Psychology for ADHD evaluation  Increase Buspar to 10 mg twice a day Follow up with MyChart message in 1 week to let MD know how you are doing  Please follow-up with PCP in Feb as scheduled  If you have any questions or concerns, please do not hesitate to call the office at [phone].  I look forward to our next visit and until then take care and stay safe.  Regards,   Carollee Leitz, MD   [facility]

## 2022-11-14 ENCOUNTER — Encounter: Payer: Self-pay | Admitting: Family Medicine

## 2022-11-25 ENCOUNTER — Encounter: Payer: Self-pay | Admitting: Family Medicine

## 2022-11-25 DIAGNOSIS — R41840 Attention and concentration deficit: Secondary | ICD-10-CM | POA: Insufficient documentation

## 2022-11-25 DIAGNOSIS — F419 Anxiety disorder, unspecified: Secondary | ICD-10-CM | POA: Insufficient documentation

## 2022-11-25 NOTE — Assessment & Plan Note (Signed)
Suspect situational anxiety given new job with increased demand, expectations and new learning curve.

## 2022-11-25 NOTE — Assessment & Plan Note (Deleted)
Suspect situational anxiety given new job with increased demand, expectations and new learning curve.

## 2022-11-25 NOTE — Assessment & Plan Note (Signed)
Well-controlled currently on 5 mg of BuSpar twice daily Will increase dosing to help with situational anxiety at work.

## 2022-11-25 NOTE — Assessment & Plan Note (Signed)
Well-controlled. Continue Celexa 40 mg daily

## 2022-12-17 ENCOUNTER — Telehealth: Payer: Self-pay | Admitting: Family

## 2022-12-17 DIAGNOSIS — J101 Influenza due to other identified influenza virus with other respiratory manifestations: Secondary | ICD-10-CM

## 2022-12-17 NOTE — Telephone Encounter (Signed)
Patient works at [employer], she called in sick today. Her work tested her and she is positive for flu, negative for COVID. They told her she would have to get Tamiflu from her provider. Her provider was Dr Aundra Dubin and does not see Dr Volanda Napoleon until next year for a TOC.

## 2022-12-18 MED ORDER — OSELTAMIVIR PHOSPHATE 75 MG PO CAPS: 75.0000 mg | ORAL_CAPSULE | Freq: Two times a day (BID) | ORAL | 0 refills | Status: AC

## 2022-12-18 NOTE — Telephone Encounter (Signed)
Call and triage  I can see positive influenza A from yesterday [facility] clinic.   When did symptoms start? Less than 48 hours?   Please ensure she does not have shortness of breath, wheezing or chest pain or other symptoms with warrant urgent care evaluation .   I have sent in Tamiflu as long as symptom started within 48 hours of symptom onset.    Please send below via mychart   Start tamiflu Most common side effects are nausea, vomiting, diarrhea.  Flu may be able to be spread to others 1 day prior to symptom onset and up to 5 to 7 days.  Please remain in quarantine until you are 24 hours without fever and WITHOUT medication such as Tylenol or ibuprofen.  You may return to work/school once you are fever free per above for 24 hours and all symptoms nearly resolved.  It is reasonable to take further precautions especially if you are around young children, or anyone that is immunocompromised, including wearing a mask, informing  those around you, and extending quarantine.   If you develop shortness of breath, chest pain, palpitations, worsening fever, or inability to stay hydrated, please call 911 or report to the nearest emergency room.  Influenza, Adult Influenza is also called "the flu." It is an infection in the lungs, nose, and throat (respiratory tract). It spreads easily from person to person (is contagious). The flu causes symptoms that are like a cold, along with high fever and body aches. What are the causes? This condition is caused by the influenza virus. You can get the virus by: Breathing in droplets that are in the air after a person infected with the flu coughed or sneezed. Touching something that has the virus on it and then touching your mouth, nose, or eyes. What increases the risk? Certain things may make you more likely to get the flu. These include: Not washing your hands often. Having close contact with many people during cold and flu season. Touching your mouth,  eyes, or nose without first washing your hands. Not getting a flu shot every year. You may have a higher risk for the flu, and serious problems, such as a lung infection (pneumonia), if you: Are older than 65. Are pregnant. Have a weakened disease-fighting system (immune system) because of a disease or because you are taking certain medicines. Have a long-term (chronic) condition, such as: Heart, kidney, or lung disease. Diabetes. Asthma. Have a liver disorder. Are very overweight (morbidly obese). Have anemia. What are the signs or symptoms? Symptoms usually begin suddenly and last 4-14 days. They may include: Fever and chills. Headaches, body aches, or muscle aches. Sore throat. Cough. Runny or stuffy (congested) nose. Feeling discomfort in your chest. Not wanting to eat as much as normal. Feeling weak or tired. Feeling dizzy. Feeling sick to your stomach or throwing up. How is this treated? If the flu is found early, you can be treated with antiviral medicine. This can help to reduce how bad the illness is and how long it lasts. This may be given by mouth or through an IV tube. Taking care of yourself at home can help your symptoms get better. Your doctor may want you to: Take over-the-counter medicines. Drink plenty of fluids. The flu often goes away on its own. If you have very bad symptoms or other problems, you may be treated in a hospital. Follow these instructions at home:   Activity Rest as needed. Get plenty of sleep. Stay home from  work or school as told by your doctor. Do not leave home until you do not have a fever for 24 hours without taking medicine. Leave home only to go to your doctor. Eating and drinking Take an ORS (oral rehydration solution). This is a drink that is sold at pharmacies and stores. Drink enough fluid to keep your pee pale yellow. Drink clear fluids in small amounts as you are able. Clear fluids include: Water. Ice chips. Fruit juice  mixed with water. Low-calorie sports drinks. Eat bland foods that are easy to digest. Eat small amounts as you are able. These foods include: Bananas. Applesauce. Rice. Lean meats. Toast. Crackers. Do not eat or drink: Fluids that have a lot of sugar or caffeine. Alcohol. Spicy or fatty foods. General instructions Take over-the-counter and prescription medicines only as told by your doctor. Use a cool mist humidifier to add moisture to the air in your home. This can make it easier for you to breathe. When using a cool mist humidifier, clean it daily. Empty water and replace with clean water. Cover your mouth and nose when you cough or sneeze. Wash your hands with soap and water often and for at least 20 seconds. This is also important after you cough or sneeze. If you cannot use soap and water, use alcohol-based hand sanitizer. Keep all follow-up visits. How is this prevented?  Get a flu shot every year. You may get the flu shot in late summer, fall, or winter. Ask your doctor when you should get your flu shot. Avoid contact with people who are sick during fall and winter. This is cold and flu season. Contact a doctor if: You get new symptoms. You have: Chest pain. Watery poop (diarrhea). A fever. Your cough gets worse. You start to have more mucus. You feel sick to your stomach. You throw up. Get help right away if you: Have shortness of breath. Have trouble breathing. Have skin or nails that turn a bluish color. Have very bad pain or stiffness in your neck. Get a sudden headache. Get sudden pain in your face or ear. Cannot eat or drink without throwing up. These symptoms may represent a serious problem that is an emergency. Get medical help right away. Call your local emergency services (911 in the [place] Do not wait to see if the symptoms will go away. Do not drive yourself to the hospital. Summary Influenza is also called "the flu." It is an infection in the lungs,  nose, and throat. It spreads easily from person to person. Take over-the-counter and prescription medicines only as told by your doctor. Getting a flu shot every year is the best way to not get the flu. This information is not intended to replace advice given to you by your health care provider. Make sure you discuss any questions you have with your health care provider. Document Revised: 08/05/2020 Document Reviewed: 08/05/2020 Elsevier Patient Education  [place]. Oseltamivir Capsules What is this medication? OSELTAMIVIR (os el TAM i vir) prevents and treats infections caused by the flu virus (influenza). It works by slowing the spread of the flu virus in your body and reducing how long your symptoms last. It will not treat colds or infections caused by bacteria or other viruses. It will not replace the annual flu vaccine. This medicine may be used for other purposes; ask your health care provider or pharmacist if you have questions. COMMON BRAND NAME(S): Tamiflu What should I tell my care team before I take this  medication? They need to know if you have any of the following conditions: Difficulty swallowing Kidney disease An unusual or allergic reaction to oseltamivir, other medications, foods, dyes, or preservatives Pregnant or trying to get pregnant Breast-feeding How should I use this medication? Take this medication by mouth with water. Take it as directed on the prescription label at the same time every day. You can take it with or without food. If it upsets your stomach, take it with food. Take all of this medication unless your care team tells you to stop it early. Keep taking it even if you think you are better. When taking whole capsules: Do not cut, crush or chew this medication. Swallow the capsules whole. When mixing capsule contents with liquid: Open the capsule and mix the contents in a small bowl with a small amount of a sweetened liquid such as chocolate syrup, corn  syrup, caramel topping, or light brown sugar (dissolved in water). Stir the mixture and take the dose right away after mixing. Talk to your care team about the use of this medication in children. While it may be prescribed for selected conditions, precautions do apply. Overdosage: If you think you have taken too much of this medicine contact a poison control center or emergency room at once. NOTE: This medicine is only for you. Do not share this medicine with others. What if I miss a dose? If you miss a dose, take it as soon as you remember. If it is almost time for your next dose (within 2 hours), take only that dose. Do not take double or extra doses. What may interact with this medication? Intranasal influenza vaccine This list may not describe all possible interactions. Give your health care provider a list of all the medicines, herbs, non-prescription drugs, or dietary supplements you use. Also tell them if you smoke, drink alcohol, or use illegal drugs. Some items may interact with your medicine. What should I watch for while using this medication? Visit your care team for regular checks on your progress. Tell your care team if your symptoms do not start to get better or if they get worse. If you have the flu, you may be at an increased risk of developing seizures, confusion, or abnormal behavior. This occurs early in the illness, and more frequently in children and teens. These events are not common, but may result in accidental injury to the patient. Families and caregivers of patients should watch for signs of unusual behavior and contact a care team right away if the patient shows signs of unusual behavior. To treat the flu, start taking this medication within 2 days of getting flu symptoms. This medication is not a substitute for the flu shot. Talk to your care team each year about an annual flu shot. What side effects may I notice from receiving this medication? Side effects that you  should report to your care team as soon as possible: Allergic reactions--skin rash, itching, hives, swelling of the face, lips, tongue, or throat Confusion Hallucinations Redness, blistering, peeling, or loosening of the skin, including inside the mouth Seizures Tremors or shaking Trouble speaking Unusual changes in behavior Side effects that usually do not require medical attention (report to your care team if they continue or are bothersome): Headache Nausea Vomiting This list may not describe all possible side effects. Call your doctor for medical advice about side effects. You may report side effects to FDA at 1-800-FDA-1088. Where should I keep my medication? Keep out of the reach  of children and pets. Store at room temperature between 15 and 30 degrees C (59 and 86 degrees F). Throw away any unused medication after the expiration date. NOTE: This sheet is a summary. It may not cover all possible information. If you have questions about this medicine, talk to your doctor, pharmacist, or health care provider.  2022 Elsevier/Gold Standard (2021-09-05 00:00:00)

## 2022-12-18 NOTE — Telephone Encounter (Signed)
Spoke to pt and informed her that Tamiflu has been sent in. She can pick up from pharmacy.

## 2023-01-24 ENCOUNTER — Encounter: Payer: Self-pay | Admitting: Dermatology

## 2023-01-24 ENCOUNTER — Ambulatory Visit: Payer: Managed Care, Other (non HMO) | Admitting: Dermatology

## 2023-01-24 VITALS — BP 120/85 | HR 74

## 2023-01-24 DIAGNOSIS — L719 Rosacea, unspecified: Secondary | ICD-10-CM

## 2023-01-24 DIAGNOSIS — L988 Other specified disorders of the skin and subcutaneous tissue: Secondary | ICD-10-CM

## 2023-01-24 DIAGNOSIS — Z1283 Encounter for screening for malignant neoplasm of skin: Secondary | ICD-10-CM

## 2023-01-24 DIAGNOSIS — L578 Other skin changes due to chronic exposure to nonionizing radiation: Secondary | ICD-10-CM

## 2023-01-24 DIAGNOSIS — Z85828 Personal history of other malignant neoplasm of skin: Secondary | ICD-10-CM

## 2023-01-24 DIAGNOSIS — L821 Other seborrheic keratosis: Secondary | ICD-10-CM

## 2023-01-24 DIAGNOSIS — C44519 Basal cell carcinoma of skin of other part of trunk: Secondary | ICD-10-CM

## 2023-01-24 DIAGNOSIS — L700 Acne vulgaris: Secondary | ICD-10-CM

## 2023-01-24 DIAGNOSIS — D492 Neoplasm of unspecified behavior of bone, soft tissue, and skin: Secondary | ICD-10-CM

## 2023-01-24 DIAGNOSIS — C4491 Basal cell carcinoma of skin, unspecified: Secondary | ICD-10-CM

## 2023-01-24 DIAGNOSIS — L814 Other melanin hyperpigmentation: Secondary | ICD-10-CM

## 2023-01-24 HISTORY — DX: Basal cell carcinoma of skin, unspecified: C44.91

## 2023-01-24 MED ORDER — TRETINOIN 0.025 % EX CREA: TOPICAL_CREAM | CUTANEOUS | 3 refills | Status: AC

## 2023-01-24 NOTE — Progress Notes (Signed)
Follow-Up Visit   Subjective  Sandra Griffin is a 52 y.o. female who presents for the following: Annual Exam (Hx of BCC's. HxAK. S/P 5FU to right temple and forehead) and Rosacea (3 month follow up. Using Metronidazole 0.75% cream and Rhofade ~3 times per week).  The patient presents for Total-Body Skin Exam (TBSE) for skin cancer screening and mole check.  The patient has spots, moles and lesions to be evaluated, some may be new or changing and the patient has concerns that these could be cancer.   The following portions of the chart were reviewed this encounter and updated as appropriate:  Tobacco  Allergies  Meds  Problems  Med Hx  Surg Hx  Fam Hx      Review of Systems: No other skin or systemic complaints except as noted in HPI or Assessment and Plan.   Objective  Well appearing patient in no apparent distress; mood and affect are within normal limits.  A full examination was performed including scalp, head, eyes, ears, nose, lips, neck, chest, axillae, abdomen, back, buttocks, bilateral upper extremities, bilateral lower extremities, hands, feet, fingers, toes, fingernails, and toenails. All findings within normal limits unless otherwise noted below.  face Macular erythema  upper mid chest 0.8 cm pink papule     face Rhytides and volume loss. Hyperpigmented macules  face Trace open and closed comedones at face.   Assessment & Plan   History of Basal Cell Carcinoma of the Skin - No evidence of recurrence today - Recommend regular full body skin exams - Recommend daily broad spectrum sunscreen SPF 30+ to sun-exposed areas, reapply every 2 hours as needed.  - Call if any new or changing lesions are noted between office visits   Lentigines - Scattered tan macules - Due to sun exposure - Benign-appearing, observe - Recommend daily broad spectrum sunscreen SPF 30+ to sun-exposed areas, reapply every 2 hours as needed. - Call for any  changes  Seborrheic Keratoses - Stuck-on, waxy, tan-brown papules and/or plaques  - Benign-appearing - Discussed benign etiology and prognosis. - Observe - Call for any changes  Melanocytic Nevi - Tan-brown and/or pink-flesh-colored symmetric macules and papules - Benign appearing on exam today - Observation - Call clinic for new or changing moles - Recommend daily use of broad spectrum spf 30+ sunscreen to sun-exposed areas.   Hemangiomas - Red papules - Discussed benign nature - Observe - Call for any changes  Actinic Damage - Chronic condition, secondary to cumulative UV/sun exposure - diffuse scaly erythematous macules with underlying dyspigmentation - Recommend daily broad spectrum sunscreen SPF 30+ to sun-exposed areas, reapply every 2 hours as needed.  - Staying in the shade or wearing long sleeves, sun glasses (UVA+UVB protection) and wide brim hats (4-inch brim around the entire circumference of the hat) are also recommended for sun protection.  - Call for new or changing lesions.  Skin cancer screening performed today.  Rosacea face  Chronic and persistent condition with duration or expected duration over one year. Condition is symptomatic/ bothersome to patient. Not currently at goal.  Pt reports flares  Rosacea is a chronic progressive skin condition usually affecting the face of adults, causing redness and/or acne bumps. It is treatable but not curable. It sometimes affects the eyes (ocular rosacea) as well. It may respond to topical and/or systemic medication and can flare with stress, sun exposure, alcohol, exercise, topical steroids (including hydrocortisone/cortisone 10) and some foods.  Daily application of broad spectrum spf 30+ sunscreen  to face is recommended to reduce flares.  Denies eye irritation or grittiness.   Discussed options of Soolantra or SM ivermectin/oxymetazoline. Patient defers change today and will try to be more consistent with therapy.  Counseled on application process.   Continue Metronidazole cream twice daily.    Continue Rhofade every morning.   Related Medications metroNIDAZOLE (METROCREAM) 0.75 % cream Apply topically 2 (two) times daily.  Oxymetazoline HCl (RHOFADE) 1 % CREA Apply a thin coat to the entire face QAM.  Neoplasm of skin upper mid chest  Skin / nail biopsy Type of biopsy: tangential   Informed consent: discussed and consent obtained   Anesthesia: the lesion was anesthetized in a standard fashion   Anesthesia comment:  Area prepped with alcohol Anesthetic:  1% lidocaine w/ epinephrine 1-100,000 buffered w/ 8.4% NaHCO3 Instrument used: flexible razor blade   Hemostasis achieved with: pressure, aluminum chloride and electrodesiccation   Outcome: patient tolerated procedure well   Post-procedure details: wound care instructions given   Post-procedure details comment:  Ointment and small bandage applied  Specimen 1 - Surgical pathology Differential Diagnosis: R/O BCC  Check Margins: No  Elastosis of skin face  With Lentigines and macular SKs  Discussed perfect derma peel.  Continue tretinoin or [employer] anti-aging compound applying to entire face  Effaclar with Salicylic acid samples given to be applied daily to affected areas of thin SKs  Acne vulgaris face  Chronic and persistent condition with duration or expected duration over one year. Condition is symptomatic/ bothersome to patient. Not currently at goal.  Start Tretinoin 0.025% cream: pea-sized amount to face at bedtime, wash off in morning.   Topical retinoid medications like tretinoin/Retin-A, adapalene/Differin, tazarotene/Fabior, and Epiduo/Epiduo Forte can cause dryness and irritation when first started. Only apply a pea-sized amount to the entire affected area. Avoid applying it around the eyes, edges of mouth and creases at the nose. If you experience irritation, use a good moisturizer first and/or apply the  medicine less often. If you are doing well with the medicine, you can increase how often you use it until you are applying every night. Be careful with sun protection while using this medication as it can make you sensitive to the sun. This medicine should not be used by pregnant women.    tretinoin (RETIN-A) 0.025 % cream - face Apply pea-sized amount to face at bedtime, wash off in morning.   Return in about 6 months (around 07/25/2023) for TBSE, HxBCC.  I, Emelia Salisbury, CMA, am acting as scribe for Forest Gleason, MD.  Documentation: I have reviewed the above documentation for accuracy and completeness, and I agree with the above.  Forest Gleason, MD

## 2023-01-24 NOTE — Patient Instructions (Addendum)
Continue Metronidazole cream twice daily.    Continue Rhofade every morning.   Effaclar with Salicylic acid samples given to be applied daily to affected areas  Start Tretinoin 0.025% cream: pea-sized amount to face at bedtime, wash off in morning.     Recommend taking Heliocare sun protection supplement daily in sunny weather for additional sun protection. For maximum protection on the sunniest days, you can take up to 2 capsules of regular Heliocare OR take 1 capsule of Heliocare Ultra. For prolonged exposure (such as a full day in the sun), you can repeat your dose of the supplement 4 hours after your first dose. Heliocare can be purchased at [employer], at some [facility] or at [email or web address].    Melanoma ABCDEs  Melanoma is the most dangerous type of skin cancer, and is the leading cause of death from skin disease.  You are more likely to develop melanoma if you: Have light-colored skin, light-colored eyes, or red or blond hair Spend a lot of time in the sun Tan regularly, either outdoors or in a tanning bed Have had blistering sunburns, especially during childhood Have a close family member who has had a melanoma Have atypical moles or large birthmarks  Early detection of melanoma is key since treatment is typically straightforward and cure rates are extremely high if we catch it early.   The first sign of melanoma is often a change in a mole or a new dark spot.  The ABCDE system is a way of remembering the signs of melanoma.  A for asymmetry:  The two halves do not match. B for border:  The edges of the growth are irregular. C for color:  A mixture of colors are present instead of an even brown color. D for diameter:  Melanomas are usually (but not always) greater than 61m - the size of a pencil eraser. E for evolution:  The spot keeps changing in size, shape, and color.  Please check your skin once per month between visits. You can use a small mirror in front and a  large mirror behind you to keep an eye on the back side or your body.   If you see any new or changing lesions before your next follow-up, please call to schedule a visit.  Please continue daily skin protection including broad spectrum sunscreen SPF 30+ to sun-exposed areas, reapplying every 2 hours as needed when you're outdoors.   Staying in the shade or wearing long sleeves, sun glasses (UVA+UVB protection) and wide brim hats (4-inch brim around the entire circumference of the hat) are also recommended for sun protection.     Due to recent changes in healthcare laws, you may see results of your pathology and/or laboratory studies on MyChart before the doctors have had a chance to review them. We understand that in some cases there may be results that are confusing or concerning to you. Please understand that not all results are received at the same time and often the doctors may need to interpret multiple results in order to provide you with the best plan of care or course of treatment. Therefore, we ask that you please give u[place]2 business days to thoroughly review all your results before contacting the office for clarification. Should we see a critical lab result, you will be contacted sooner.   If You Need Anything After Your Visit  If you have any questions or concerns for your doctor, please call our main line at [phone] press option 4  to reach your doctor's medical assistant. If no one answers, please leave a voicemail as directed and we will return your call as soon as possible. Messages left after 4 pm will be answered the following business day.   You may also send [place] a message via [place]. We typically respond to MyChart messages within 1-2 business days.  For prescription refills, please ask your pharmacy to contact our office. Our fax number is [phone].  If you have an urgent issue when the clinic is closed that cannot wait until the next business day, you can page your  doctor at the number below.    Please note that while we do our best to be available for urgent issues outside of office hours, we are not available 24/7.   If you have an urgent issue and are unable to reach [place], you may choose to seek medical care at your doctor's office, retail clinic, urgent care center, or emergency room.  If you have a medical emergency, please immediately call 911 or go to the emergency department.  Pager Numbers  - Dr. Nehemiah Massed: [phone]  - Dr. Laurence Ferrari: [phone]  - Dr. Nicole Kindred: [phone]  In the event of inclement weather, please call our main line at [phone] for an update on the status of any delays or closures.  Dermatology Medication Tips: Please keep the boxes that topical medications come in in order to help keep track of the instructions about where and how to use these. Pharmacies typically print the medication instructions only on the boxes and not directly on the medication tubes.   If your medication is too expensive, please contact our office at [phone] option 4 or send [place] a message through [place].   We are unable to tell what your co-pay for medications will be in advance as this is different depending on your insurance coverage. However, we may be able to find a substitute medication at lower cost or fill out paperwork to get insurance to cover a needed medication.   If a prior authorization is required to get your medication covered by your insurance company, please allow [place] 1-2 business days to complete this process.  Drug prices often vary depending on where the prescription is filled and some pharmacies may offer cheaper prices.  The website www.goodrx.com contains coupons for medications through different pharmacies. The prices here do not account for what the cost may be with help from insurance (it may be cheaper with your insurance), but the website can give you the price if you did not use any insurance.  - You can print  the associated coupon and take it with your prescription to the pharmacy.  - You may also stop by our office during regular business hours and pick up a GoodRx coupon card.  - If you need your prescription sent electronically to a different pharmacy, notify our office through [facility] or by phone at [phone] option 4.     Si Usted Necesita Algo Despus de Su Visita  Tambin puede enviarnos un mensaje a travs de Pharmacist, community. Por lo general respondemos a los mensajes de MyChart en el transcurso de 1 a 2 das hbiles.  Para renovar recetas, por favor pida a su farmacia que se ponga en contacto con nuestra oficina. Harland Dingwall de fax es [place] [phone].  Si tiene un asunto urgente cuando la clnica est cerrada y que no puede esperar hasta el siguiente da hbil, puede llamar/localizar a su doctor(a) al nmero que aparece a  continuacin.   Por favor, tenga en cuenta que aunque hacemos todo lo posible para estar disponibles para asuntos urgentes fuera del horario de [place], no estamos disponibles las 24 horas del da, los 7 das de la [place].   Si tiene un problema urgente y no puede comunicarse con nosotros, puede optar por buscar atencin mdica  en el consultorio de su doctor(a), en una clnica privada, en un centro de atencin urgente o en una sala de emergencias.  Si tiene Engineering geologist, por favor llame inmediatamente al 911 o vaya a la sala de emergencias.  Nmeros de bper  - Dr. Nehemiah Massed: [phone]  - Dra. Moye: [phone]  - Dra. Nicole Kindred: [phone]  En caso de inclemencias del [place], por favor llame a Johnsie Kindred principal al [phone] para una actualizacin sobre el [place] de cualquier retraso o cierre.  Consejos para la medicacin en dermatologa: Por favor, guarde las cajas en las que vienen los medicamentos de uso tpico para ayudarle a seguir las instrucciones sobre dnde y cmo usarlos. Las farmacias generalmente imprimen las  instrucciones del medicamento slo en las cajas y no directamente en los tubos del [place].   Si su medicamento es muy caro, por favor, pngase en contacto con Zigmund Daniel llamando al [phone] y presione la opcin 4 o envenos un mensaje a travs de Pharmacist, community.   No podemos decirle cul ser su copago por los medicamentos por adelantado ya que esto es diferente dependiendo de la cobertura de su seguro. Sin embargo, es posible que podamos encontrar un medicamento sustituto a Electrical engineer un formulario para que el seguro cubra el medicamento que se considera necesario.   Si se requiere una autorizacin previa para que su compaa de seguros [place] su medicamento, por favor permtanos de 1 a 2 das hbiles para completar este proceso.  Los precios de los medicamentos varan con frecuencia dependiendo del Environmental consultant de dnde se surte la receta y alguna farmacias pueden ofrecer precios ms baratos.  El sitio web www.goodrx.com tiene cupones para medicamentos de Airline pilot. Los precios aqu no tienen en cuenta lo que podra costar con la ayuda del seguro (puede ser ms barato con su seguro), pero el sitio web puede darle el precio si no utiliz Research scientist (physical sciences).  - Puede imprimir el cupn correspondiente y llevarlo con su receta a la farmacia.  - Tambin puede pasar por nuestra oficina durante el horario de atencin regular y Charity fundraiser una tarjeta de cupones de GoodRx.  - Si necesita que su receta se enve electrnicamente a una farmacia diferente, informe a nuestra oficina a travs de MyChart de [place] o por telfono llamando al [phone] y presione la opcin 4.

## 2023-01-30 ENCOUNTER — Other Ambulatory Visit: Payer: Self-pay

## 2023-01-30 ENCOUNTER — Telehealth: Payer: Self-pay

## 2023-01-30 MED ORDER — VALACYCLOVIR HCL 500 MG PO TABS: ORAL_TABLET | ORAL | 0 refills | Status: DC

## 2023-01-30 NOTE — Telephone Encounter (Addendum)
Patient notified of results. Patient prefers excision vs ED&C.Patient scheduled for surgery on 03/09/23. Patient verbalized understanding and denied further question at this time.       ----- Message from Alfonso Patten, MD sent at 01/30/2023  2:40 PM EST ----- Skin , upper mid chest BASAL CELL CARCINOMA, SUPERFICIAL AND NODULAR PATTERNS --> recommend excision (higher cure rate and nicer cosmesis but slightly higher risk of bleeding and infection, and she would need to take it easy for 2 weeks after) > ED&C, though both area reasonable  Excision involves cutting out the spot with an area of normal looking skin around it followed by closing it with stitches. The cure rate is approximately 92-93%. It leaves a line scar, and you must take it easy for two weeks after surgery (no lifting over 10-15 lbs, avoid activity to get your heart rate and blood pressure up). There is a slightly higher risk of infection, bleeding or the wound opening up with this compared to the scrape and burn.  ED&C has about an 85% cure rate and leaves a round wound the size of the skin cancer which is healed with ointment and a bandage over a few weeks time. It leaves a round white scar. No additional pathology is done. If the skin cancer were to come back, we would need to do a surgery to remove it.   MAs please call with results and schedule. Let me know if she wishes to speak with me. Thank you!

## 2023-02-06 ENCOUNTER — Encounter: Payer: Self-pay | Admitting: Dermatology

## 2023-02-19 ENCOUNTER — Encounter: Payer: Managed Care, Other (non HMO) | Admitting: Family Medicine

## 2023-02-27 ENCOUNTER — Ambulatory Visit (INDEPENDENT_AMBULATORY_CARE_PROVIDER_SITE_OTHER): Payer: Self-pay | Admitting: Dermatology

## 2023-02-27 VITALS — BP 124/87 | HR 80

## 2023-02-27 DIAGNOSIS — B009 Herpesviral infection, unspecified: Secondary | ICD-10-CM

## 2023-02-27 DIAGNOSIS — L988 Other specified disorders of the skin and subcutaneous tissue: Secondary | ICD-10-CM

## 2023-02-27 MED ORDER — HYDROCORTISONE 2.5 % EX OINT: TOPICAL_OINTMENT | CUTANEOUS | 0 refills | Status: AC

## 2023-02-27 MED ORDER — VALACYCLOVIR HCL 500 MG PO TABS: ORAL_TABLET | ORAL | 0 refills | Status: AC

## 2023-02-27 NOTE — Patient Instructions (Addendum)
Peel Aftercare Instructions  Wash gently with cetaphil or other gentle cleanser twice daily Apply Vaseline twice a day for 4 days then vanicream twice a day Start vinegar soaks once or twice daily. Use one tablespoon white vinegar in one pint of warm water  If itchy, apply hydrocortisone 2.5% ointment twice a day as needed (itching can cause scratching=scar) Use daily sunscreen SPF 30 or higher  Your skin will look and feel tight. Expect about 4 days of swelling- peaks at 2-3 days then the skin begins to peel around day 3. Some oozing is normal. Keep covered with Vaseline to prevent any crusting, then switch to vanicream moisturizer after 4 days. DO NOT pick/scratch (can cause scarring).    Due to recent changes in healthcare laws, you may see results of your pathology and/or laboratory studies on MyChart before the doctors have had a chance to review them. We understand that in some cases there may be results that are confusing or concerning to you. Please understand that not all results are received at the same time and often the doctors may need to interpret multiple results in order to provide you with the best plan of care or course of treatment. Therefore, we ask that you please give [place] 2 business days to thoroughly review all your results before contacting the office for clarification. Should we see a critical lab result, you will be contacted sooner.   If You Need Anything After Your Visit  If you have any questions or concerns for your doctor, please call our main line at [phone] and press option 4 to reach your doctor's medical assistant. If no one answers, please leave a voicemail as directed and we will return your call as soon as possible. Messages left after 4 pm will be answered the following business day.   You may also send [place] a message via [place]. We typically respond to MyChart messages within 1-2 business days.  For prescription refills, please ask your pharmacy to  contact our office. Our fax number is [phone].  If you have an urgent issue when the clinic is closed that cannot wait until the next business day, you can page your doctor at the number below.    Please note that while we do our best to be available for urgent issues outside of office hours, we are not available 24/7.   If you have an urgent issue and are unable to reach [place], you may choose to seek medical care at your doctor's office, retail clinic, urgent care center, or emergency room.  If you have a medical emergency, please immediately call 911 or go to the emergency department.  Pager Numbers  - Dr. Nehemiah Massed: [phone]  - Dr. Laurence Ferrari: [phone]  - Dr. Nicole Kindred: [phone]  In the event of inclement weather, please call our main line at [phone] for an update on the status of any delays or closures.  Dermatology Medication Tips: Please keep the boxes that topical medications come in in order to help keep track of the instructions about where and how to use these. Pharmacies typically print the medication instructions only on the boxes and not directly on the medication tubes.   If your medication is too expensive, please contact our office at [phone] option 4 or send [place] a message through [place].   We are unable to tell what your co-pay for medications will be in advance as this is different depending on your insurance coverage. However, we may be able to find a  substitute medication at lower cost or fill out paperwork to get insurance to cover a needed medication.   If a prior authorization is required to get your medication covered by your insurance company, please allow [place] 1-2 business days to complete this process.  Drug prices often vary depending on where the prescription is filled and some pharmacies may offer cheaper prices.  The website www.goodrx.com contains coupons for medications through different pharmacies. The prices here do not account for what  the cost may be with help from insurance (it may be cheaper with your insurance), but the website can give you the price if you did not use any insurance.  - You can print the associated coupon and take it with your prescription to the pharmacy.  - You may also stop by our office during regular business hours and pick up a GoodRx coupon card.  - If you need your prescription sent electronically to a different pharmacy, notify our office through [facility] or by phone at [phone] option 4.     Si Usted Necesita Algo Despus de Su Visita  Tambin puede enviarnos un mensaje a travs de Pharmacist, community. Por lo general respondemos a los mensajes de MyChart en el transcurso de 1 a 2 das hbiles.  Para renovar recetas, por favor pida a su farmacia que se ponga en contacto con nuestra oficina. Harland Dingwall de fax es [place] [phone].  Si tiene un asunto urgente cuando la clnica est cerrada y que no puede esperar hasta el siguiente da hbil, puede llamar/localizar a su doctor(a) al nmero que aparece a continuacin.   Por favor, tenga en cuenta que aunque hacemos todo lo posible para estar disponibles para asuntos urgentes fuera del horario de [place], no estamos disponibles las 24 horas del da, los 7 das de la [place].   Si tiene un problema urgente y no puede comunicarse con nosotros, puede optar por buscar atencin mdica  en el consultorio de su doctor(a), en una clnica privada, en un centro de atencin urgente o en una sala de emergencias.  Si tiene Engineering geologist, por favor llame inmediatamente al 911 o vaya a la sala de emergencias.  Nmeros de bper  - Dr. Nehemiah Massed: [phone]  - Dra. Hadley Soileau: [phone]  - Dra. Nicole Kindred: [phone]  En caso de inclemencias del [place], por favor llame a Johnsie Kindred principal al [phone] para una actualizacin sobre el [place] de cualquier retraso o cierre.  Consejos para la medicacin en dermatologa: Por favor, guarde las  cajas en las que vienen los medicamentos de uso tpico para ayudarle a seguir las instrucciones sobre dnde y cmo usarlos. Las farmacias generalmente imprimen las instrucciones del medicamento slo en las cajas y no directamente en los tubos del [place].   Si su medicamento es muy caro, por favor, pngase en contacto con Zigmund Daniel llamando al [phone] y presione la opcin 4 o envenos un mensaje a travs de Pharmacist, community.   No podemos decirle cul ser su copago por los medicamentos por adelantado ya que esto es diferente dependiendo de la cobertura de su seguro. Sin embargo, es posible que podamos encontrar un medicamento sustituto a Electrical engineer un formulario para que el seguro cubra el medicamento que se considera necesario.   Si se requiere una autorizacin previa para que su compaa de seguros [place] su medicamento, por favor permtanos de 1 a 2 das hbiles para completar este proceso.  Los precios de los medicamentos varan con frecuencia dependiendo del Environmental consultant  de dnde se surte la receta y alguna farmacias pueden ofrecer precios ms baratos.  El sitio web www.goodrx.com tiene cupones para medicamentos de Airline pilot. Los precios aqu no tienen en cuenta lo que podra costar con la ayuda del seguro (puede ser ms barato con su seguro), pero el sitio web puede darle el precio si no utiliz Research scientist (physical sciences).  - Puede imprimir el cupn correspondiente y llevarlo con su receta a la farmacia.  - Tambin puede pasar por nuestra oficina durante el horario de atencin regular y Charity fundraiser una tarjeta de cupones de GoodRx.  - Si necesita que su receta se enve electrnicamente a una farmacia diferente, informe a nuestra oficina a travs de MyChart de [place] o por telfono llamando al [phone] y presione la opcin 4.

## 2023-02-27 NOTE — Progress Notes (Signed)
Follow-Up Visit   Subjective  Sandra Griffin is a 52 y.o. female who presents for the following: Facial Elastosis (Patient here today for chemical peel ).   The following portions of the chart were reviewed this encounter and updated as appropriate:   Tobacco  Allergies  Meds  Problems  Med Hx  Surg Hx  Fam Hx      Review of Systems:  No other skin or systemic complaints except as noted in HPI or Assessment and Plan.  Objective  Well appearing patient in no apparent distress; mood and affect are within normal limits.  A focused examination was performed including the face. Relevant physical exam findings are noted in the Assessment and Plan.  Face                                      Assessment & Plan  Herpes simplex  Related Medications valACYclovir (VALTREX) 500 MG tablet Starting 24-36 hours prior to procedure take 1 po bid with a full glass of water for a total of 10 days.  Elastosis of skin Face  With lentigines, thin seborrheic keratoses  Pt had planned Perfect Peel today, but as we are having issues with our Perfect Peel, will use TCA instead after discussion of options.  Aftercare: Wash gently with cetaphil twice daily Vaseline twice a day for 4 days then vanicream twice a day Start vinegar soaks daily. Use one tablespoon white vinegar in one pint of warm water  If itchy, can use 1% hydrocortisone ointment (itching can cause scratching=scar) Use daily sunscreen  Skin will look and feel tight. Expect about 4 days of swelling- peaks at 2-3 days then the skin begins to peel around day 3. Some oozing is normal. Keep covered with Vaseline to prevent any crusting, then vanicream after 4 days. DO NOT pick/scratch (can cause scarring).  Continue Valtrex as prescribed.   Facial chemical peel - Face Location: Face, forehead, and cheeks  Chemical used: TCA 30%   Ruben level two frost.   Informed consent: Discussed  risks (infection, pain, swelling, peeling, blistering, allergic reaction, redness, textural changes of skin, crusting, ulceration, permanent discoloration of skin, undesirable cosmetic result, need for additional treatment, herpes outbreak, and scarring) and benefits of the procedure, as well as the alternatives. Informed consent was obtained.  HSV Prophylaxis: Patient confirms they started valacyclovir prior to the procedure and will continue as directed.  Preparation: The area was cleansed prior to the application of the chemical. Acetone was used to degrease the skin prior to applying the peel.   Procedure Details: Ointment was applied to the corners of the eyes, nose, and mouth. The chemical was applied using a standard technique with multiple passes to achieve a Rubin II frost. The patient was fanned for comfort.  Plan:  After the procedure an application of sunscreen was done to the treated area. Patient was instructed on how to appropriately cleanse and moisturize the treated area.  Patient was instructed to apply sunscreen daily and use Tylenol (acetaminophen) for any pain. Patient will call for any problems including crusting, ulceration, fever, pus, or significant discomfort.  Return to clinic for additional treatment as needed.   Related Medications hydrocortisone 2.5 % ointment Apply to itchy areas of face BID PRN up to one week.   Return for appointment as scheduled.  Luther Redo, CMA, am acting as scribe for Forest Gleason, MD .  Documentation: I have reviewed the above documentation for accuracy and completeness, and I agree with the above.  Forest Gleason, MD

## 2023-03-04 ENCOUNTER — Encounter: Payer: Self-pay | Admitting: Dermatology

## 2023-03-06 ENCOUNTER — Ambulatory Visit (INDEPENDENT_AMBULATORY_CARE_PROVIDER_SITE_OTHER): Payer: Managed Care, Other (non HMO) | Admitting: Dermatology

## 2023-03-06 ENCOUNTER — Encounter: Payer: Self-pay | Admitting: Dermatology

## 2023-03-06 DIAGNOSIS — C44519 Basal cell carcinoma of skin of other part of trunk: Secondary | ICD-10-CM | POA: Diagnosis not present

## 2023-03-06 MED ORDER — MUPIROCIN 2 % EX OINT: 1.0000 | TOPICAL_OINTMENT | Freq: Every day | CUTANEOUS | 0 refills | Status: AC

## 2023-03-06 NOTE — Progress Notes (Signed)
   Follow-Up Visit   Subjective  Sandra Griffin is a 52 y.o. female who presents for the following: Procedure (Patient here today for excision of bx proven BCC at mid upper chest.).  The following portions of the chart were reviewed this encounter and updated as appropriate:   Tobacco  Allergies  Meds  Problems  Med Hx  Surg Hx  Fam Hx      Review of Systems:  No other skin or systemic complaints except as noted in HPI or Assessment and Plan.  Objective  Well appearing patient in no apparent distress; mood and affect are within normal limits.  A focused examination was performed including chest. Relevant physical exam findings are noted in the Assessment and Plan.  upper mid chest Pink plaque    Assessment & Plan  Basal cell carcinoma (BCC) of skin of other part of torso upper mid chest  Skin excision  Lesion length (cm):  1.3 Total excision diameter (cm):  2.1 Informed consent: discussed and consent obtained   Timeout: patient name, date of birth, surgical site, and procedure verified   Procedure prep:  Patient was prepped and draped in usual sterile fashion Prep type:  Chlorhexidine Anesthesia: the lesion was anesthetized in a standard fashion   Anesthetic:  1% lidocaine w/ epinephrine 1-100,000 buffered w/ 8.4% NaHCO3 Instrument used comment:  15c Hemostasis achieved with: pressure and electrodesiccation    mupirocin ointment (BACTROBAN) 2 % Apply 1 Application topically daily.  Skin repair Complexity:  Complex Final length (cm):  5.2 Informed consent: discussed and consent obtained   Timeout: patient name, date of birth, surgical site, and procedure verified   Procedure prep:  Patient was prepped and draped in usual sterile fashion Prep type:  Chlorhexidine Anesthesia: the lesion was anesthetized in a standard fashion   Anesthetic:  1% lidocaine w/ epinephrine 1-100,000 local infiltration Reason for type of repair: reduce tension to allow closure,  reduce the risk of dehiscence, infection, and necrosis, allow closure of the large defect, allow side-to-side closure without requiring a flap or graft and enhance both functionality and cosmetic results   Undermining: area extensively undermined   Subcutaneous layers (deep stitches):  Suture size:  3-0 and 2-0 Suture type: Vicryl (polyglactin 910)   Fine/surface layer approximation (top stitches):  Suture size:  4-0 Suture type: Prolene (polypropylene)   Stitches comment:  Running horizontal mattress Hemostasis achieved with: suture, pressure and electrodesiccation Outcome: patient tolerated procedure well with no complications   Post-procedure details: wound care instructions given   Additional details:  Retention sutures/fascial plication sutures placed to reduce tension to close the defect without tension in a tight area without requiring extensive undermining  Mupirocin and a pressure dressing applied  Specimen 1 - Surgical pathology Differential Diagnosis: Bx proven BCC Tagged at left tip Check Margins: yes Pink bx site [id number]   Tagged at L tip   Return in about 1 week (around 03/13/2023) for Suture Removal.  Graciella Belton, RMA, am acting as scribe for Forest Gleason, MD .  Documentation: I have reviewed the above documentation for accuracy and completeness, and I agree with the above.  Forest Gleason, MD

## 2023-03-06 NOTE — Patient Instructions (Signed)
Wound Care Instructions for After Surgery  On the day following your surgery, you should begin doing daily dressing changes until your sutures are removed: Remove the bandage. Cleanse the wound gently with soap and water.  Make sure you then dry the skin surrounding the wound completely or the tape will not stick to the skin. Do not use cotton balls on the wound. After the wound is clean and dry, apply the ointment (either prescription antibiotic prescribed by your doctor or plain Vaseline if nothing was prescribed) gently with a Q-tip. If you are using a bandaid to cover: Apply a bandaid large enough to cover the entire wound. If you do not have a bandaid large enough to cover the wound OR if you are sensitive to bandaid adhesive: Cut a non-stick pad (such as Telfa) to fit the size of the wound.  Cover the wound with the non-stick pad. If the wound is draining, you may want to add a small amount of gauze on top of the non-stick pad for a little added compression to the area. Use tape to seal the area completely.  For the next 1-2 weeks: Be sure to keep the wound moist with ointment 24/7 to ensure best healing. If you are unable to cover the wound with a bandage to hold the ointment in place, you may need to reapply the ointment several times a day. Do not bend over or lift heavy items to reduce the chance of elevated blood pressure to the wound. Do not participate in particularly strenuous activities.  Below is a list of dressing supplies you might need.  Cotton-tipped applicators - Q-tips Gauze pads (2x2 and/or 4x4) - All-Purpose Sponges New and clean tube of petroleum jelly (Vaseline) OR prescription antibiotic ointment if prescribed Either a bandaid large enough to cover the entire wound OR non-stick dressing material (Telfa) and Tape (Paper or Hypafix)  FOR ADULT SURGERY PATIENTS: If you need something for pain relief, you may take 1 extra strength Tylenol (acetaminophen) and 2  ibuprofen (200 mg) together every 4 hours as needed. (Do not take these medications if you are allergic to them or if you know you cannot take them for any other reason). Typically you may only need pain medication for 1-3 days.   Comments on the Post-Operative Period Slight swelling and redness often appear around the wound. This is normal and will disappear within several days following the surgery. The healing wound will drain a brownish-red-yellow discharge during healing. This is a normal phase of wound healing. As the wound begins to heal, the drainage may increase in amount. Again, this drainage is normal. Notify us if the drainage becomes persistently bloody, excessively swollen, or intensely painful or develops a foul odor or red streaks.  The healing wound will also typically be itchy. This is normal. If you have severe or persistent pain, Notify us if the discomfort is severe or persistent. Avoid alcoholic beverages when taking pain medicine.  In Case of Wound Hemorrhage A wound hemorrhage is when the bandage suddenly becomes soaked with bright red blood and flows profusely. If this happens, sit down or lie down with your head elevated. If the wound has a dressing on it, do not remove the dressing. Apply pressure to the existing gauze. If the wound is not covered, use a gauze pad to apply pressure and continue applying the pressure for 20 minutes without peeking. DO NOT COVER THE WOUND WITH A LARGE TOWEL OR [place] CLOTH. Release your hand from the  wound site but do not remove the dressing. If the bleeding has stopped, gently clean around the wound. Leave the dressing in place for 24 hours if possible. This wait time allows the blood vessels to close off so that you do not spark a new round of bleeding by disrupting the newly clotted blood vessels with an immediate dressing change. If the bleeding does not subside, continue to hold pressure for 40 minutes. If bleeding continues, page your  physician, contact an After Hours clinic or go to the Emergency Room.  Due to recent changes in healthcare laws, you may see results of your pathology and/or laboratory studies on MyChart before the doctors have had a chance to review them. We understand that in some cases there may be results that are confusing or concerning to you. Please understand that not all results are received at the same time and often the doctors may need to interpret multiple results in order to provide you with the best plan of care or course of treatment. Therefore, we ask that you please give [place] 2 business days to thoroughly review all your results before contacting the office for clarification. Should we see a critical lab result, you will be contacted sooner.   If You Need Anything After Your Visit  If you have any questions or concerns for your doctor, please call our main line at [phone] and press option 4 to reach your doctor's medical assistant. If no one answers, please leave a voicemail as directed and we will return your call as soon as possible. Messages left after 4 pm will be answered the following business day.   You may also send [place] a message via [place]. We typically respond to MyChart messages within 1-2 business days.  For prescription refills, please ask your pharmacy to contact our office. Our fax number is [phone].  If you have an urgent issue when the clinic is closed that cannot wait until the next business day, you can page your doctor at the number below.    Please note that while we do our best to be available for urgent issues outside of office hours, we are not available 24/7.   If you have an urgent issue and are unable to reach [place], you may choose to seek medical care at your doctor's office, retail clinic, urgent care center, or emergency room.  If you have a medical emergency, please immediately call 911 or go to the emergency department.  Pager Numbers  - Dr. Nehemiah Massed:  [phone]  - Dr. Laurence Ferrari: [phone]  - Dr. Nicole Kindred: [phone]  In the event of inclement weather, please call our main line at [phone] for an update on the status of any delays or closures.  Dermatology Medication Tips: Please keep the boxes that topical medications come in in order to help keep track of the instructions about where and how to use these. Pharmacies typically print the medication instructions only on the boxes and not directly on the medication tubes.   If your medication is too expensive, please contact our office at [phone] option 4 or send [place] a message through [place].   We are unable to tell what your co-pay for medications will be in advance as this is different depending on your insurance coverage. However, we may be able to find a substitute medication at lower cost or fill out paperwork to get insurance to cover a needed medication.   If a prior authorization is required to get your medication covered by  your insurance company, please allow [place] 1-2 business days to complete this process.  Drug prices often vary depending on where the prescription is filled and some pharmacies may offer cheaper prices.  The website www.goodrx.com contains coupons for medications through different pharmacies. The prices here do not account for what the cost may be with help from insurance (it may be cheaper with your insurance), but the website can give you the price if you did not use any insurance.  - You can print the associated coupon and take it with your prescription to the pharmacy.  - You may also stop by our office during regular business hours and pick up a GoodRx coupon card.  - If you need your prescription sent electronically to a different pharmacy, notify our office through [facility] or by phone at [phone] option 4.     Si Usted Necesita Algo Despus de Su Visita  Tambin puede enviarnos un mensaje a travs de Pharmacist, community. Por lo general  respondemos a los mensajes de MyChart en el transcurso de 1 a 2 das hbiles.  Para renovar recetas, por favor pida a su farmacia que se ponga en contacto con nuestra oficina. Harland Dingwall de fax es [place] [phone].  Si tiene un asunto urgente cuando la clnica est cerrada y que no puede esperar hasta el siguiente da hbil, puede llamar/localizar a su doctor(a) al nmero que aparece a continuacin.   Por favor, tenga en cuenta que aunque hacemos todo lo posible para estar disponibles para asuntos urgentes fuera del horario de [place], no estamos disponibles las 24 horas del da, los 7 das de la [place].   Si tiene un problema urgente y no puede comunicarse con nosotros, puede optar por buscar atencin mdica  en el consultorio de su doctor(a), en una clnica privada, en un centro de atencin urgente o en una sala de emergencias.  Si tiene Engineering geologist, por favor llame inmediatamente al 911 o vaya a la sala de emergencias.  Nmeros de bper  - Dr. Nehemiah Massed: [phone]  - Dra. Moye: [phone]  - Dra. Nicole Kindred: [phone]  En caso de inclemencias del [place], por favor llame a Johnsie Kindred principal al [phone] para una actualizacin sobre el [place] de cualquier retraso o cierre.  Consejos para la medicacin en dermatologa: Por favor, guarde las cajas en las que vienen los medicamentos de uso tpico para ayudarle a seguir las instrucciones sobre dnde y cmo usarlos. Las farmacias generalmente imprimen las instrucciones del medicamento slo en las cajas y no directamente en los tubos del [place].   Si su medicamento es muy caro, por favor, pngase en contacto con Zigmund Daniel llamando al [phone] y presione la opcin 4 o envenos un mensaje a travs de Pharmacist, community.   No podemos decirle cul ser su copago por los medicamentos por adelantado ya que esto es diferente dependiendo de la cobertura de su seguro. Sin embargo, es posible que podamos encontrar un  medicamento sustituto a Electrical engineer un formulario para que el seguro cubra el medicamento que se considera necesario.   Si se requiere una autorizacin previa para que su compaa de seguros [place] su medicamento, por favor permtanos de 1 a 2 das hbiles para completar este proceso.  Los precios de los medicamentos varan con frecuencia dependiendo del Environmental consultant de dnde se surte la receta y alguna farmacias pueden ofrecer precios ms baratos.  El sitio web www.goodrx.com tiene cupones para medicamentos de Airline pilot. Los precios aqu no tienen  en cuenta lo que podra costar con la ayuda del seguro (puede ser ms barato con su seguro), pero el sitio web puede darle el precio si no Field seismologist.  - Puede imprimir el cupn correspondiente y llevarlo con su receta a la farmacia.  - Tambin puede pasar por nuestra oficina durante el horario de atencin regular y Charity fundraiser una tarjeta de cupones de GoodRx.  - Si necesita que su receta se enve electrnicamente a una farmacia diferente, informe a nuestra oficina a travs de MyChart de [place] o por telfono llamando al [phone] y presione la opcin 4.

## 2023-03-13 ENCOUNTER — Ambulatory Visit (INDEPENDENT_AMBULATORY_CARE_PROVIDER_SITE_OTHER): Payer: Managed Care, Other (non HMO) | Admitting: Dermatology

## 2023-03-13 VITALS — BP 130/83 | HR 71

## 2023-03-13 DIAGNOSIS — Z4802 Encounter for removal of sutures: Secondary | ICD-10-CM

## 2023-03-13 DIAGNOSIS — Z85828 Personal history of other malignant neoplasm of skin: Secondary | ICD-10-CM

## 2023-03-13 MED ORDER — DOXYCYCLINE MONOHYDRATE 100 MG PO TABS: ORAL_TABLET | ORAL | 0 refills | Status: AC

## 2023-03-13 NOTE — Patient Instructions (Addendum)
Doxycycline should be taken with food to prevent nausea. Do not lay down for 30 minutes after taking. Be cautious with sun exposure and use good sun protection while on this medication. Pregnant women should not take this medication.   Recommend Serica moisturizing scar formula cream every night or [facility] brand or Mederma silicone scar sheet every night for the first year after a scar appears to help with scar remodeling if desired. Scars remodel on their own for a full year and will gradually improve in appearance over time.   Due to recent changes in healthcare laws, you may see results of your pathology and/or laboratory studies on MyChart before the doctors have had a chance to review them. We understand that in some cases there may be results that are confusing or concerning to you. Please understand that not all results are received at the same time and often the doctors may need to interpret multiple results in order to provide you with the best plan of care or course of treatment. Therefore, we ask that you please give [place] 2 business days to thoroughly review all your results before contacting the office for clarification. Should we see a critical lab result, you will be contacted sooner.   If You Need Anything After Your Visit  If you have any questions or concerns for your doctor, please call our main line at [phone] and press option 4 to reach your doctor's medical assistant. If no one answers, please leave a voicemail as directed and we will return your call as soon as possible. Messages left after 4 pm will be answered the following business day.   You may also send [place] a message via [place]. We typically respond to MyChart messages within 1-2 business days.  For prescription refills, please ask your pharmacy to contact our office. Our fax number is [phone].  If you have an urgent issue when the clinic is closed that cannot wait until the next business day, you can page your  doctor at the number below.    Please note that while we do our best to be available for urgent issues outside of office hours, we are not available 24/7.   If you have an urgent issue and are unable to reach [place], you may choose to seek medical care at your doctor's office, retail clinic, urgent care center, or emergency room.  If you have a medical emergency, please immediately call 911 or go to the emergency department.  Pager Numbers  - Dr. Nehemiah Massed: [phone]  - Dr. Laurence Ferrari: [phone]  - Dr. Nicole Kindred: [phone]  In the event of inclement weather, please call our main line at [phone] for an update on the status of any delays or closures.  Dermatology Medication Tips: Please keep the boxes that topical medications come in in order to help keep track of the instructions about where and how to use these. Pharmacies typically print the medication instructions only on the boxes and not directly on the medication tubes.   If your medication is too expensive, please contact our office at [phone] option 4 or send [place] a message through [place].   We are unable to tell what your co-pay for medications will be in advance as this is different depending on your insurance coverage. However, we may be able to find a substitute medication at lower cost or fill out paperwork to get insurance to cover a needed medication.   If a prior authorization is required to get your medication covered by  your insurance company, please allow [place] 1-2 business days to complete this process.  Drug prices often vary depending on where the prescription is filled and some pharmacies may offer cheaper prices.  The website www.goodrx.com contains coupons for medications through different pharmacies. The prices here do not account for what the cost may be with help from insurance (it may be cheaper with your insurance), but the website can give you the price if you did not use any insurance.  - You can print  the associated coupon and take it with your prescription to the pharmacy.  - You may also stop by our office during regular business hours and pick up a GoodRx coupon card.  - If you need your prescription sent electronically to a different pharmacy, notify our office through [facility] or by phone at [phone] option 4.     Si Usted Necesita Algo Despus de Su Visita  Tambin puede enviarnos un mensaje a travs de Pharmacist, community. Por lo general respondemos a los mensajes de MyChart en el transcurso de 1 a 2 das hbiles.  Para renovar recetas, por favor pida a su farmacia que se ponga en contacto con nuestra oficina. Harland Dingwall de fax es [place] [phone].  Si tiene un asunto urgente cuando la clnica est cerrada y que no puede esperar hasta el siguiente da hbil, puede llamar/localizar a su doctor(a) al nmero que aparece a continuacin.   Por favor, tenga en cuenta que aunque hacemos todo lo posible para estar disponibles para asuntos urgentes fuera del horario de [place], no estamos disponibles las 24 horas del da, los 7 das de la [place].   Si tiene un problema urgente y no puede comunicarse con nosotros, puede optar por buscar atencin mdica  en el consultorio de su doctor(a), en una clnica privada, en un centro de atencin urgente o en una sala de emergencias.  Si tiene Engineering geologist, por favor llame inmediatamente al 911 o vaya a la sala de emergencias.  Nmeros de bper  - Dr. Nehemiah Massed: [phone]  - Dra. Moye: [phone]  - Dra. Nicole Kindred: [phone]  En caso de inclemencias del [place], por favor llame a Johnsie Kindred principal al [phone] para una actualizacin sobre el [place] de cualquier retraso o cierre.  Consejos para la medicacin en dermatologa: Por favor, guarde las cajas en las que vienen los medicamentos de uso tpico para ayudarle a seguir las instrucciones sobre dnde y cmo usarlos. Las farmacias generalmente imprimen las  instrucciones del medicamento slo en las cajas y no directamente en los tubos del [place].   Si su medicamento es muy caro, por favor, pngase en contacto con Zigmund Daniel llamando al [phone] y presione la opcin 4 o envenos un mensaje a travs de Pharmacist, community.   No podemos decirle cul ser su copago por los medicamentos por adelantado ya que esto es diferente dependiendo de la cobertura de su seguro. Sin embargo, es posible que podamos encontrar un medicamento sustituto a Electrical engineer un formulario para que el seguro cubra el medicamento que se considera necesario.   Si se requiere una autorizacin previa para que su compaa de seguros [place] su medicamento, por favor permtanos de 1 a 2 das hbiles para completar este proceso.  Los precios de los medicamentos varan con frecuencia dependiendo del Environmental consultant de dnde se surte la receta y alguna farmacias pueden ofrecer precios ms baratos.  El sitio web www.goodrx.com tiene cupones para medicamentos de Airline pilot. Los precios aqu no tienen  en cuenta lo que podra costar con la ayuda del seguro (puede ser ms barato con su seguro), pero el sitio web puede darle el precio si no Field seismologist.  - Puede imprimir el cupn correspondiente y llevarlo con su receta a la farmacia.  - Tambin puede pasar por nuestra oficina durante el horario de atencin regular y Charity fundraiser una tarjeta de cupones de GoodRx.  - Si necesita que su receta se enve electrnicamente a una farmacia diferente, informe a nuestra oficina a travs de MyChart de [place] o por telfono llamando al [phone] y presione la opcin 4.

## 2023-03-13 NOTE — Progress Notes (Signed)
   Follow-Up Visit   Subjective  Sandra Griffin is a 52 y.o. female who presents for the following: Suture removal/post op (Pathology proven margins free BCC of the upper mid chest, patient is here today for suture removal).  The following portions of the chart were reviewed this encounter and updated as appropriate:   Tobacco  Allergies  Meds  Problems  Med Hx  Surg Hx  Fam Hx      Review of Systems:  No other skin or systemic complaints except as noted in HPI or Assessment and Plan.  Objective  Well appearing patient in no apparent distress; mood and affect are within normal limits.  A focused examination was performed including the face and chest. Relevant physical exam findings are noted in the Assessment and Plan.  upper mid chest Healing excision site with some surrounding erythema.     Assessment & Plan  History of basal cell carcinoma upper mid chest  Encounter for Removal of Sutures - Incision site at the upper mid chest is clean, dry and intact. - Wound cleansed, sutures removed, wound cleansed and steri strips applied.  - Discussed pathology results showing a margins free basal cell carcinoma.  - Patient advised to keep steri-strips dry until they fall off. - Scars remodel for a full year. - Once steri-strips fall off, patient can apply over-the-counter silicone scar cream each night to help with scar remodeling if desired. - Patient advised to call with any concerns or if they notice any new or changing lesions.  Since patient is experiencing tenderness and there is some erythema. Start Doxycycline 100 mg po BID with food (non-dairy) x 1 week. Doxycycline should be taken with food to prevent nausea. Do not lay down for 30 minutes after taking. Be cautious with sun exposure and use good sun protection while on this medication. Pregnant women should not take this medication.    doxycycline (ADOXA) 100 MG tablet - upper mid chest Take one tab po BID with  food (non-dairy) x 7 days.   Return for appointment as scheduled.  Luther Redo, CMA, am acting as scribe for Forest Gleason, MD .  Documentation: I have reviewed the above documentation for accuracy and completeness, and I agree with the above.  Forest Gleason, MD

## 2023-03-18 ENCOUNTER — Encounter: Payer: Self-pay | Admitting: Dermatology

## 2023-07-25 ENCOUNTER — Encounter: Payer: Managed Care, Other (non HMO) | Admitting: Dermatology

## 2023-08-29 ENCOUNTER — Encounter: Payer: Managed Care, Other (non HMO) | Admitting: Dermatology

## 2023-10-07 ENCOUNTER — Encounter: Payer: Managed Care, Other (non HMO) | Admitting: Dermatology

## 2023-10-10 ENCOUNTER — Ambulatory Visit: Payer: Managed Care, Other (non HMO) | Admitting: Dermatology

## 2023-10-10 ENCOUNTER — Encounter: Payer: Self-pay | Admitting: Dermatology

## 2023-10-10 VITALS — BP 156/103 | HR 73

## 2023-10-10 DIAGNOSIS — D229 Melanocytic nevi, unspecified: Secondary | ICD-10-CM

## 2023-10-10 DIAGNOSIS — L578 Other skin changes due to chronic exposure to nonionizing radiation: Secondary | ICD-10-CM | POA: Diagnosis not present

## 2023-10-10 DIAGNOSIS — L821 Other seborrheic keratosis: Secondary | ICD-10-CM | POA: Diagnosis not present

## 2023-10-10 DIAGNOSIS — L814 Other melanin hyperpigmentation: Secondary | ICD-10-CM | POA: Diagnosis not present

## 2023-10-10 DIAGNOSIS — Z1283 Encounter for screening for malignant neoplasm of skin: Secondary | ICD-10-CM | POA: Diagnosis not present

## 2023-10-10 DIAGNOSIS — W908XXA Exposure to other nonionizing radiation, initial encounter: Secondary | ICD-10-CM

## 2023-10-10 DIAGNOSIS — Z86018 Personal history of other benign neoplasm: Secondary | ICD-10-CM

## 2023-10-10 DIAGNOSIS — Z85828 Personal history of other malignant neoplasm of skin: Secondary | ICD-10-CM

## 2023-10-10 DIAGNOSIS — D1801 Hemangioma of skin and subcutaneous tissue: Secondary | ICD-10-CM

## 2023-10-10 NOTE — Progress Notes (Signed)
   Follow-Up Visit   Subjective  Sandra Griffin is a 52 y.o. female who presents for the following: Skin Cancer Screening and Full Body Skin Exam Hx of bcc, hx of dysplastic   The patient presents for Total-Body Skin Exam (TBSE) for skin cancer screening and mole check. The patient has spots, moles and lesions to be evaluated, some may be new or changing and the patient may have concern these could be cancer.   The following portions of the chart were reviewed this encounter and updated as appropriate: medications, allergies, medical history  Review of Systems:  No other skin or systemic complaints except as noted in HPI or Assessment and Plan.  Objective  Well appearing patient in no apparent distress; mood and affect are within normal limits.  A full examination was performed including scalp, head, eyes, ears, nose, lips, neck, chest, axillae, abdomen, back, buttocks, bilateral upper extremities, bilateral lower extremities, hands, feet, fingers, toes, fingernails, and toenails. All findings within normal limits unless otherwise noted below.   Relevant physical exam findings are noted in the Assessment and Plan.    Assessment & Plan   SKIN CANCER SCREENING PERFORMED TODAY.  ACTINIC DAMAGE - Chronic condition, secondary to cumulative UV/sun exposure - diffuse scaly erythematous macules with underlying dyspigmentation - Recommend daily broad spectrum sunscreen SPF 30+ to sun-exposed areas, reapply every 2 hours as needed.  - Staying in the shade or wearing long sleeves, sun glasses (UVA+UVB protection) and wide brim hats (4-inch brim around the entire circumference of the hat) are also recommended for sun protection.  - Call for new or changing lesions.  LENTIGINES, SEBORRHEIC KERATOSES, HEMANGIOMAS - Benign normal skin lesions - Benign-appearing - Call for any changes  MELANOCYTIC NEVI - Tan-brown and/or pink-flesh-colored symmetric macules and papules - Benign appearing  on exam today - Observation - Call clinic for new or changing moles - Recommend daily use of broad spectrum spf 30+ sunscreen to sun-exposed areas.   HISTORY OF BASAL CELL CARCINOMA OF THE SKIN Upper mid chest excision 03/06/23 Right medial pretibial Moh's 12/22 Central chest lateral edge of scar 05/2017 - No evidence of recurrence today - Recommend regular full body skin exams - Recommend daily broad spectrum sunscreen SPF 30+ to sun-exposed areas, reapply every 2 hours as needed.  - Call if any new or changing lesions are noted between office visits  HISTORY OF DYSPLASTIC NEVUS 01/2008 right mid back moderate excision No evidence of recurrence today Recommend regular full body skin exams Recommend daily broad spectrum sunscreen SPF 30+ to sun-exposed areas, reapply every 2 hours as needed.  Call if any new or changing lesions are noted between office visits   Return in about 6 months (around 04/09/2024) for TBSE.  I, Asher Muir, CMA, am acting as scribe for Elie Goody, MD.   Documentation: I have reviewed the above documentation for accuracy and completeness, and I agree with the above.  Elie Goody, MD

## 2023-10-10 NOTE — Patient Instructions (Signed)

## 2024-04-27 ENCOUNTER — Ambulatory Visit: Payer: Managed Care, Other (non HMO) | Admitting: Dermatology
